# Patient Record
Sex: Female | Born: 1950 | Race: Black or African American | Hispanic: No | Smoking: Never smoker
Health system: Southern US, Community
[De-identification: ages and names within clinical notes are randomized; demographics above are authoritative.]

---

## 2006-10-08 ENCOUNTER — Emergency Department (HOSPITAL_COMMUNITY): Admission: EM | Admit: 2006-10-08 | Discharge: 2006-10-08 | Payer: Self-pay | Admitting: Emergency Medicine

## 2006-10-08 ENCOUNTER — Encounter: Admission: RE | Admit: 2006-10-08 | Discharge: 2006-10-08 | Payer: Self-pay | Admitting: Occupational Medicine

## 2007-04-15 ENCOUNTER — Encounter: Admission: RE | Admit: 2007-04-15 | Discharge: 2007-04-15 | Payer: Self-pay | Admitting: Obstetrics and Gynecology

## 2007-05-13 ENCOUNTER — Encounter (INDEPENDENT_AMBULATORY_CARE_PROVIDER_SITE_OTHER): Payer: Self-pay | Admitting: Gastroenterology

## 2007-05-13 ENCOUNTER — Ambulatory Visit (HOSPITAL_COMMUNITY): Admission: RE | Admit: 2007-05-13 | Discharge: 2007-05-13 | Payer: Self-pay | Admitting: Gastroenterology

## 2008-07-03 IMAGING — CR DG SHOULDER 2+V*R*
4 series · 4 of 4 positions shown · non-contrast
Comparison: None.

CLINICAL DATA: Fell down and landed on shoulder. Stiffness and pain.
 RIGHT SHOULDER ? 3 VIEW:

[view not recorded (1 of 4)]
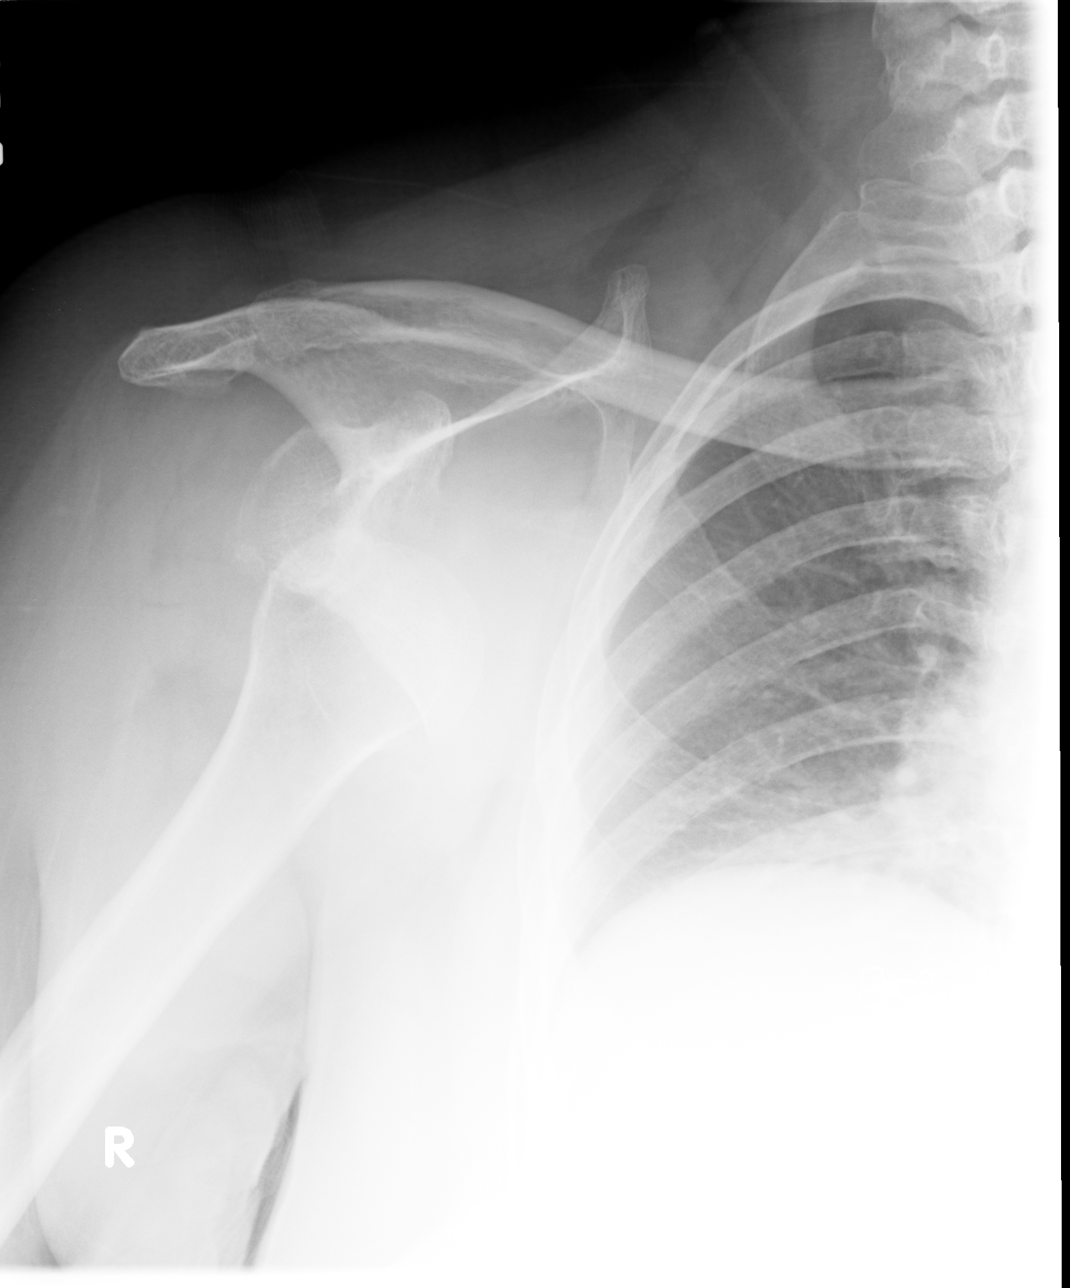

[view not recorded (2 of 4)]
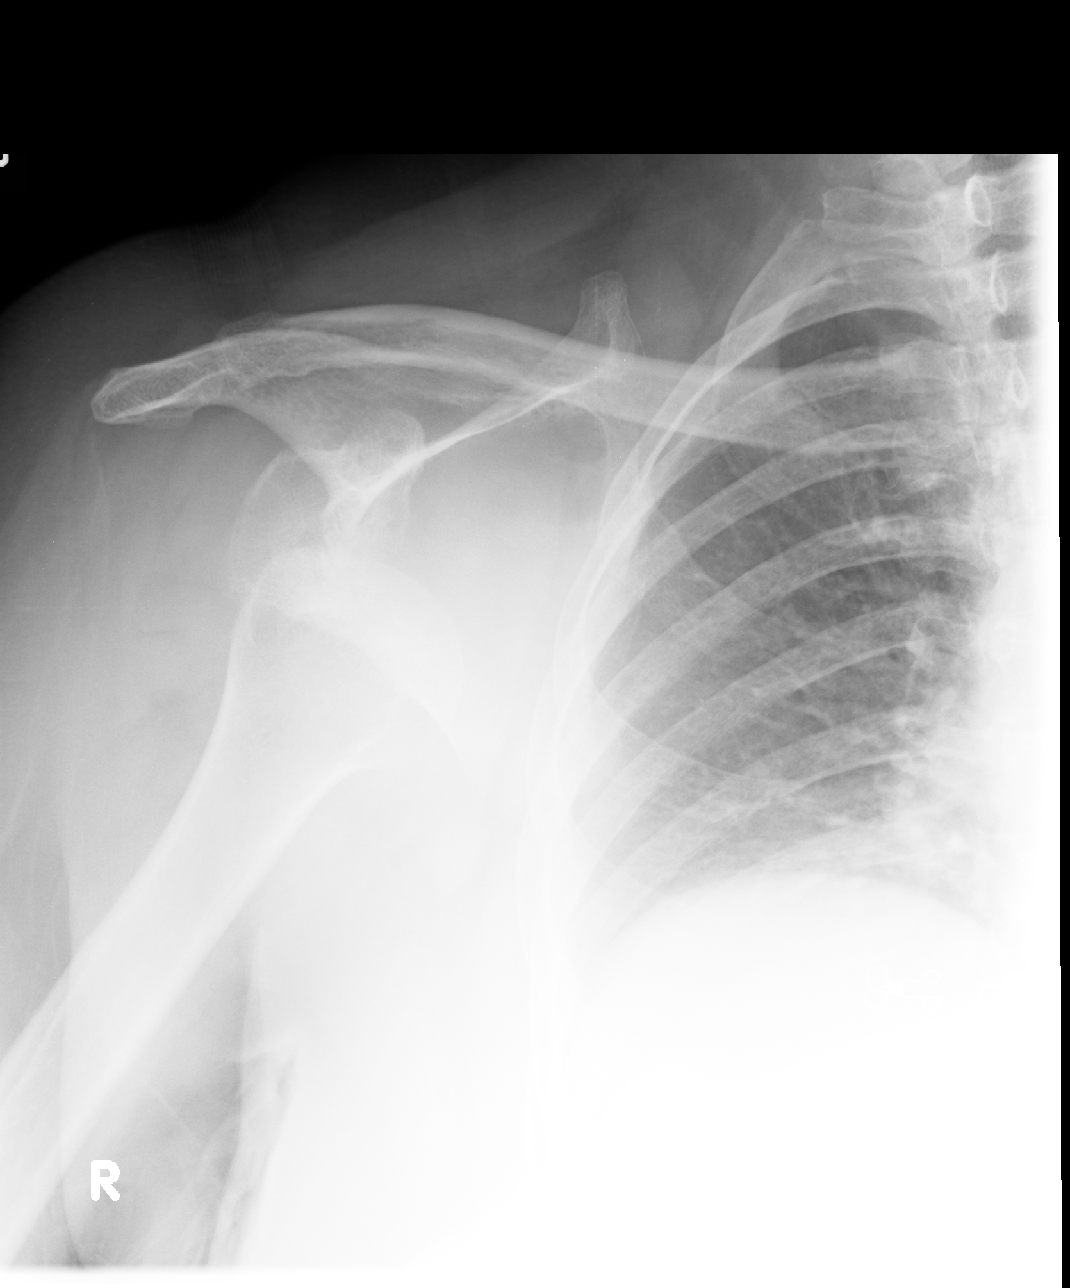

[view not recorded (3 of 4)]
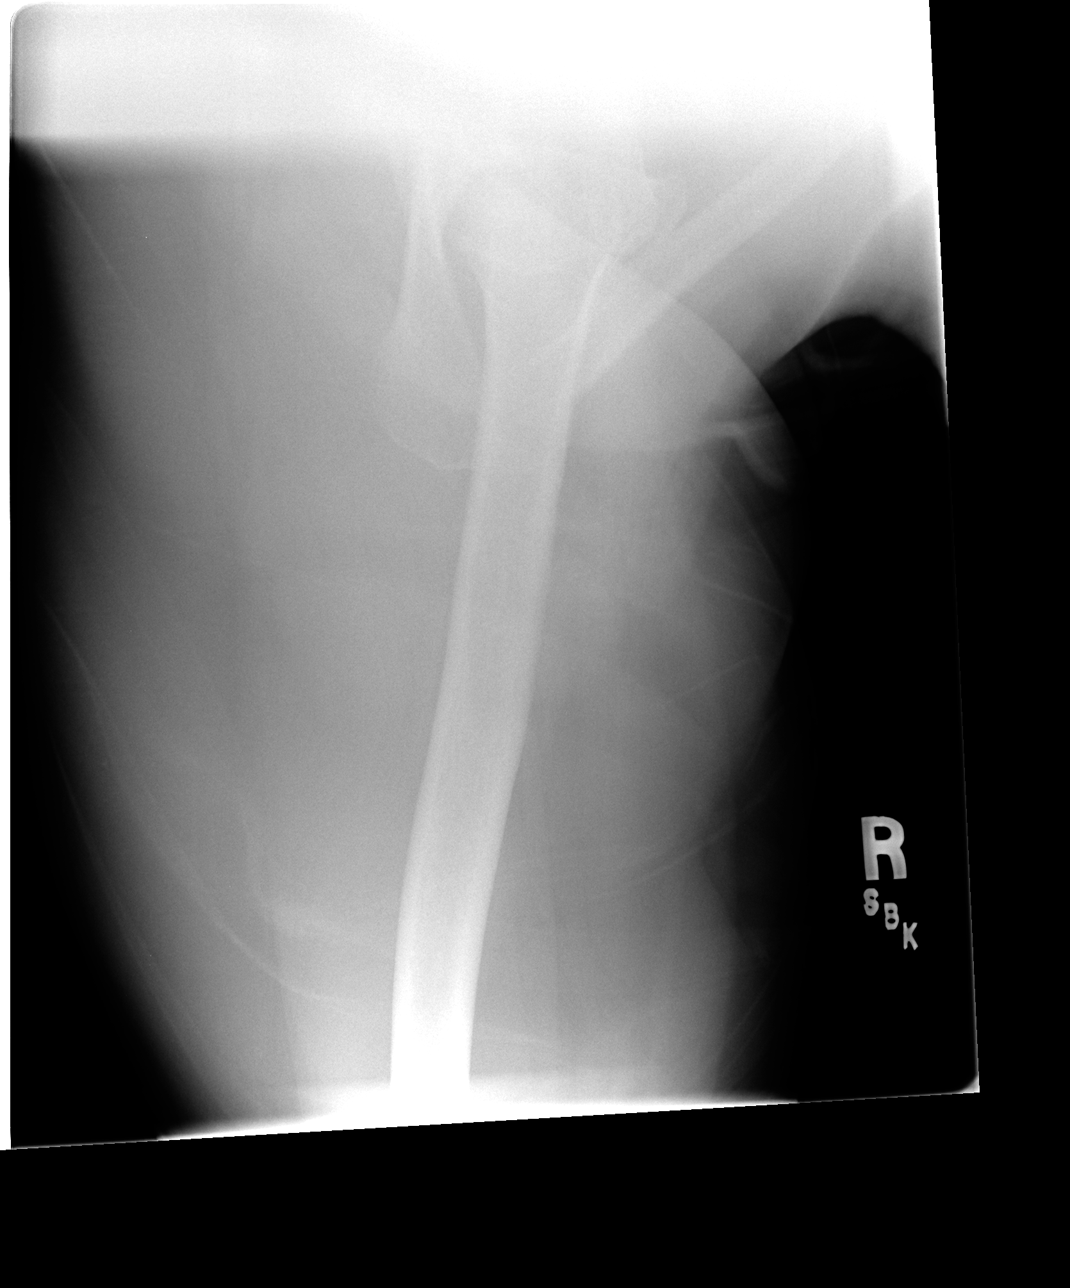

[view not recorded (4 of 4)]
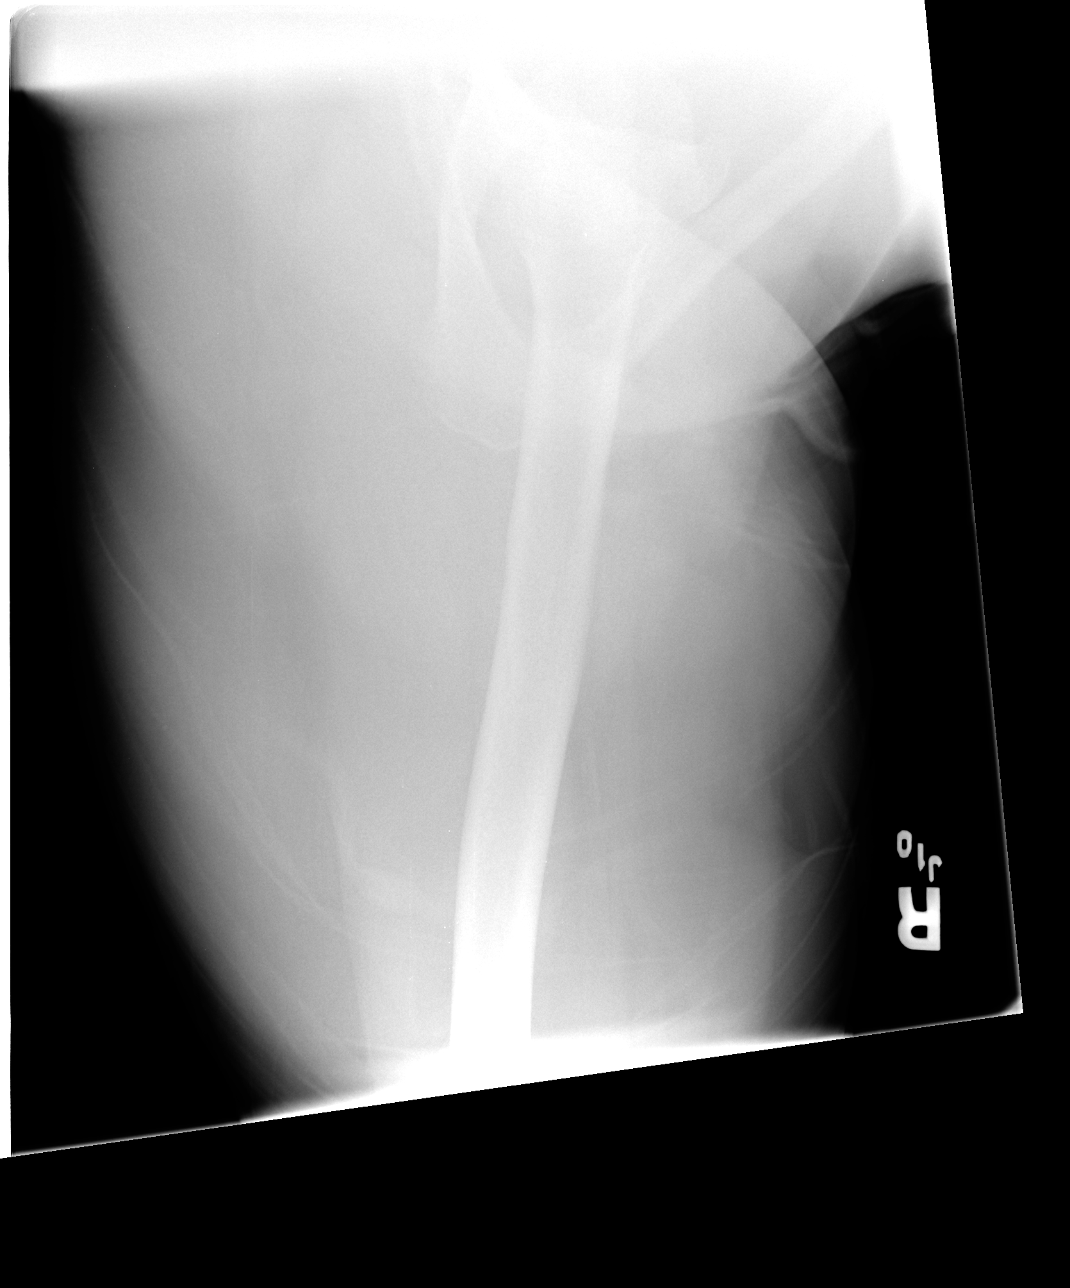

[4 of 4 positions shown; findings below may reference images not displayed]

FINDINGS: The humeral head is anteriorly dislocated.  A fracture is not readily identified. The acromioclavicular joint and visualized right ribs are intact.
IMPRESSION: Anterior right shoulder dislocation.

## 2011-03-21 NOTE — Op Note (Signed)
[name], Amber Pham                ACCOUNT NO.:  [id number]   MEDICAL RECORD NO.:  [id number]          PATIENT TYPE:  AMB   LOCATION:  ENDO                         FACILITY:  [facility]   PHYSICIAN:  Anselmo Rod, M.D.  DATE OF BIRTH:  August 11, 1951   DATE OF PROCEDURE:  05/13/2007  DATE OF DISCHARGE:                               OPERATIVE REPORT   PROCEDURE:  Colonoscopy with multiple cold biopsies.   ENDOSCOPIST:  Anselmo Rod, M.D.   INSTRUMENT USED:  Pentax video colonoscope.   INDICATIONS FOR PROCEDURE:  A 60 year old African-American female  undergoing screening colonoscopy to rule out colonic polyps, masses,  etc.   PREPROCEDURE PREPARATION:  Informed consent was procured from the  patient. The patient fasted for eight hours prior to the procedure and  prepped with a bottle of magnesium citrate and a gallon of Nulytely the  night prior to the procedure. The risks and benefits of the procedure  including a 10% missed rate of cancer and polyp were discussed with the  patient as well.   PREPROCEDURE PHYSICAL:  The patient had stable vital signs. Neck supple.  Chest clear to auscultation. S1, S2 regular. Abdomen soft with normal  bowel sounds.   DESCRIPTION OF PROCEDURE:  The patient was placed in the left lateral  decubitus position and sedated with 50 mcg of Fentanyl and 5 mg of  Versed given intravenously in slow incremental doses. Once the patient  was adequately sedated and maintained on low flow oxygen and continuous  cardiac monitoring, the Olympus video colonoscope was advanced from the  rectum to the cecum. A small polyp was biopsied from the mid right  colon; another polyp was biopsied from 90 cm. They were both placed in  the same bottle, there was no evidence of diverticulosis. The terminal  ileum appeared healthy and without lesions. There was some residual  stool in the right colon, multiple washings were done. The patient's  position was changed from the left  lateral supine position with gentle  application of abdominal pressure at the cecal base. Retroflexion of the  rectum revealed no evidence of hemorrhoids.   IMPRESSION:  1. Small sessile polyp biopsied from the right colon and another polyp      biopsied from 90 cm.  2. No evidence of diverticulosis.  3. Normal terminal ileum.   RECOMMENDATIONS:  1. Await pathology results.  2. Avoid all nonsteroidals including aspirin for the next 2 weeks.  3. Repeat colonoscopy depending on pathology results.  4. Outpatient followup as the need arising in the future.      Anselmo Rod, M.D.  Electronically Signed     JNM/MEDQ  D:  05/13/2007  T:  05/13/2007  Job:  [id number]   cc:   Naima A. Normand Sloop, M.D.  Fax: [phone]

## 2011-08-16 ENCOUNTER — Other Ambulatory Visit (HOSPITAL_COMMUNITY): Payer: Self-pay | Admitting: Obstetrics and Gynecology

## 2011-08-16 DIAGNOSIS — Z1231 Encounter for screening mammogram for malignant neoplasm of breast: Secondary | ICD-10-CM

## 2011-08-29 ENCOUNTER — Ambulatory Visit (HOSPITAL_COMMUNITY)
Admission: RE | Admit: 2011-08-29 | Discharge: 2011-08-29 | Disposition: A | Discharge status: Home / Self Care | Payer: 59 | Source: Ambulatory Visit | Attending: Obstetrics and Gynecology | Admitting: Obstetrics and Gynecology

## 2011-08-29 DIAGNOSIS — Z1231 Encounter for screening mammogram for malignant neoplasm of breast: Secondary | ICD-10-CM

## 2012-08-08 ENCOUNTER — Other Ambulatory Visit: Payer: Self-pay | Admitting: Obstetrics and Gynecology

## 2012-08-08 DIAGNOSIS — Z1231 Encounter for screening mammogram for malignant neoplasm of breast: Secondary | ICD-10-CM

## 2012-08-29 ENCOUNTER — Ambulatory Visit (HOSPITAL_COMMUNITY)
Admission: RE | Admit: 2012-08-29 | Discharge: 2012-08-29 | Disposition: A | Discharge status: Home / Self Care | Payer: 59 | Source: Ambulatory Visit | Attending: Obstetrics and Gynecology | Admitting: Obstetrics and Gynecology

## 2012-08-29 DIAGNOSIS — Z1231 Encounter for screening mammogram for malignant neoplasm of breast: Secondary | ICD-10-CM | POA: Insufficient documentation

## 2013-08-20 ENCOUNTER — Other Ambulatory Visit: Payer: Self-pay | Admitting: Obstetrics and Gynecology

## 2013-08-20 DIAGNOSIS — Z1231 Encounter for screening mammogram for malignant neoplasm of breast: Secondary | ICD-10-CM

## 2013-09-04 ENCOUNTER — Ambulatory Visit (HOSPITAL_COMMUNITY)
Admission: RE | Admit: 2013-09-04 | Discharge: 2013-09-04 | Disposition: A | Discharge status: Home / Self Care | Payer: 59 | Source: Ambulatory Visit | Attending: Obstetrics and Gynecology | Admitting: Obstetrics and Gynecology

## 2013-09-04 DIAGNOSIS — Z1231 Encounter for screening mammogram for malignant neoplasm of breast: Secondary | ICD-10-CM | POA: Insufficient documentation

## 2014-11-29 ENCOUNTER — Encounter (HOSPITAL_COMMUNITY): Payer: Self-pay | Admitting: Emergency Medicine

## 2014-11-29 ENCOUNTER — Encounter (HOSPITAL_COMMUNITY)
Admission: EM | Disposition: A | Discharge status: Nursing Home (Includes ICF) | Payer: Self-pay | Source: Home / Self Care

## 2014-11-29 ENCOUNTER — Emergency Department (HOSPITAL_COMMUNITY): Payer: No Typology Code available for payment source

## 2014-11-29 ENCOUNTER — Emergency Department (HOSPITAL_COMMUNITY): Payer: No Typology Code available for payment source | Admitting: Certified Registered Nurse Anesthetist

## 2014-11-29 ENCOUNTER — Inpatient Hospital Stay (HOSPITAL_COMMUNITY)
Admission: EM | Admit: 2014-11-29 | Discharge: 2014-12-07 | DRG: 570 | Disposition: A | Discharge status: Other Acute Inpatient Hospital | Payer: No Typology Code available for payment source | Attending: Surgery | Admitting: Surgery

## 2014-11-29 DIAGNOSIS — R0902 Hypoxemia: Secondary | ICD-10-CM | POA: Diagnosis not present

## 2014-11-29 DIAGNOSIS — N289 Disorder of kidney and ureter, unspecified: Secondary | ICD-10-CM | POA: Diagnosis present

## 2014-11-29 DIAGNOSIS — A419 Sepsis, unspecified organism: Secondary | ICD-10-CM | POA: Diagnosis present

## 2014-11-29 DIAGNOSIS — Z6841 Body Mass Index (BMI) 40.0 and over, adult: Secondary | ICD-10-CM

## 2014-11-29 DIAGNOSIS — L0231 Cutaneous abscess of buttock: Secondary | ICD-10-CM | POA: Diagnosis present

## 2014-11-29 DIAGNOSIS — M726 Necrotizing fasciitis: Secondary | ICD-10-CM | POA: Diagnosis present

## 2014-11-29 DIAGNOSIS — E46 Unspecified protein-calorie malnutrition: Secondary | ICD-10-CM | POA: Diagnosis present

## 2014-11-29 DIAGNOSIS — Z79899 Other long term (current) drug therapy: Secondary | ICD-10-CM

## 2014-11-29 DIAGNOSIS — E876 Hypokalemia: Secondary | ICD-10-CM | POA: Diagnosis present

## 2014-11-29 DIAGNOSIS — D62 Acute posthemorrhagic anemia: Secondary | ICD-10-CM | POA: Diagnosis not present

## 2014-11-29 DIAGNOSIS — M7989 Other specified soft tissue disorders: Secondary | ICD-10-CM | POA: Diagnosis present

## 2014-11-29 DIAGNOSIS — Y808 Miscellaneous physical medicine devices associated with adverse incidents, not elsewhere classified: Secondary | ICD-10-CM

## 2014-11-29 HISTORY — PX: INCISION AND DRAINAGE ABSCESS: SHX5864

## 2014-11-29 LAB — HEMOGLOBIN A1C
Hgb A1c MFr Bld: 6 % — ABNORMAL HIGH (ref <5.7)
MEAN PLASMA GLUCOSE: 126 mg/dL — AB (ref <117)

## 2014-11-29 LAB — URINALYSIS, ROUTINE W REFLEX MICROSCOPIC
Glucose, UA: NEGATIVE mg/dL
KETONES UR: NEGATIVE mg/dL
LEUKOCYTES UA: NEGATIVE
Nitrite: NEGATIVE
PH: 5.5 (ref 5.0–8.0)
PROTEIN: 100 mg/dL — AB
Specific Gravity, Urine: 1.017 (ref 1.005–1.030)
Urobilinogen, UA: 4 mg/dL — ABNORMAL HIGH (ref 0.0–1.0)

## 2014-11-29 LAB — COMPREHENSIVE METABOLIC PANEL
ALBUMIN: 2.4 g/dL — AB (ref 3.5–5.2)
ALK PHOS: 109 U/L (ref 39–117)
ALT: 27 U/L (ref 0–35)
ANION GAP: 14 (ref 5–15)
AST: 120 U/L — AB (ref 0–37)
BUN: 61 mg/dL — AB (ref 6–23)
CALCIUM: 8.5 mg/dL (ref 8.4–10.5)
CO2: 21 mmol/L (ref 19–32)
Chloride: 96 mmol/L (ref 96–112)
Creatinine, Ser: 2.23 mg/dL — ABNORMAL HIGH (ref 0.50–1.10)
GFR calc Af Amer: 26 mL/min — ABNORMAL LOW (ref >90)
GFR, EST NON AFRICAN AMERICAN: 22 mL/min — AB (ref >90)
Glucose, Bld: 125 mg/dL — ABNORMAL HIGH (ref 70–99)
Potassium: 3.3 mmol/L — ABNORMAL LOW (ref 3.5–5.1)
SODIUM: 131 mmol/L — AB (ref 135–145)
TOTAL PROTEIN: 7 g/dL (ref 6.0–8.3)
Total Bilirubin: 1.8 mg/dL — ABNORMAL HIGH (ref 0.3–1.2)

## 2014-11-29 LAB — GLUCOSE, CAPILLARY
GLUCOSE-CAPILLARY: 138 mg/dL — AB (ref 70–99)
Glucose-Capillary: 102 mg/dL — ABNORMAL HIGH (ref 70–99)
Glucose-Capillary: 106 mg/dL — ABNORMAL HIGH (ref 70–99)
Glucose-Capillary: 120 mg/dL — ABNORMAL HIGH (ref 70–99)
Glucose-Capillary: 127 mg/dL — ABNORMAL HIGH (ref 70–99)

## 2014-11-29 LAB — I-STAT TROPONIN, ED: Troponin i, poc: 0 ng/mL (ref 0.00–0.08)

## 2014-11-29 LAB — CBC WITH DIFFERENTIAL/PLATELET
Basophils Absolute: 0.3 10*3/uL — ABNORMAL HIGH (ref 0.0–0.1)
Basophils Relative: 1 % (ref 0–1)
Eosinophils Absolute: 0 10*3/uL (ref 0.0–0.7)
Eosinophils Relative: 0 % (ref 0–5)
HCT: 32.6 % — ABNORMAL LOW (ref 36.0–46.0)
Hemoglobin: 11.6 g/dL — ABNORMAL LOW (ref 12.0–15.0)
LYMPHS PCT: 8 % — AB (ref 12–46)
Lymphs Abs: 2.3 10*3/uL (ref 0.7–4.0)
MCH: 31.2 pg (ref 26.0–34.0)
MCHC: 35.6 g/dL (ref 30.0–36.0)
MCV: 87.6 fL (ref 78.0–100.0)
MONOS PCT: 6 % (ref 3–12)
Monocytes Absolute: 1.7 10*3/uL — ABNORMAL HIGH (ref 0.1–1.0)
NEUTROS ABS: 24.1 10*3/uL — AB (ref 1.7–7.7)
Neutrophils Relative %: 85 % — ABNORMAL HIGH (ref 43–77)
Platelets: 296 10*3/uL (ref 150–400)
RBC: 3.72 MIL/uL — ABNORMAL LOW (ref 3.87–5.11)
RDW: 14.1 % (ref 11.5–15.5)
WBC Morphology: INCREASED
WBC: 28.4 10*3/uL — ABNORMAL HIGH (ref 4.0–10.5)

## 2014-11-29 LAB — URINE MICROSCOPIC-ADD ON

## 2014-11-29 LAB — I-STAT CG4 LACTIC ACID, ED: LACTIC ACID, VENOUS: 3.24 mmol/L — AB (ref 0.5–2.0)

## 2014-11-29 LAB — PROCALCITONIN: Procalcitonin: 14.76 ng/mL

## 2014-11-29 SURGERY — INCISION AND DRAINAGE, ABSCESS
Anesthesia: General | Site: Buttocks

## 2014-11-29 MED ORDER — VANCOMYCIN HCL 10 G IV SOLR
1750.0000 mg | INTRAVENOUS | Status: DC
  Administered 2014-11-30 – 2014-12-02 (×3): 1750 mg via INTRAVENOUS
  Filled 2014-11-29 (×5): qty 1750

## 2014-11-29 MED ORDER — SODIUM CHLORIDE 0.9 % IR SOLN
Status: DC | PRN
  Administered 2014-11-29 (×2): 3000 mL

## 2014-11-29 MED ORDER — OXYCODONE-ACETAMINOPHEN 5-325 MG PO TABS
1.0000 | ORAL_TABLET | ORAL | Status: DC | PRN
  Administered 2014-11-29 – 2014-12-01 (×2): 2 via ORAL
  Administered 2014-12-01: 1 via ORAL
  Administered 2014-12-02 – 2014-12-05 (×6): 2 via ORAL
  Filled 2014-11-29 (×10): qty 2

## 2014-11-29 MED ORDER — PHENYLEPHRINE HCL 10 MG/ML IJ SOLN
10.0000 mg | INTRAMUSCULAR | Status: DC | PRN
  Administered 2014-11-29: 50 ug/min via INTRAVENOUS

## 2014-11-29 MED ORDER — PIPERACILLIN-TAZOBACTAM 3.375 G IVPB 30 MIN
3.3750 g | Freq: Once | INTRAVENOUS | Status: AC
  Administered 2014-11-29: 3.375 g via INTRAVENOUS
  Filled 2014-11-29: qty 50

## 2014-11-29 MED ORDER — HYDROMORPHONE HCL 1 MG/ML IJ SOLN
1.0000 mg | INTRAMUSCULAR | Status: DC | PRN
  Administered 2014-11-30 – 2014-12-03 (×8): 1 mg via INTRAVENOUS
  Filled 2014-11-29 (×8): qty 1

## 2014-11-29 MED ORDER — FENTANYL CITRATE 0.05 MG/ML IJ SOLN
INTRAMUSCULAR | Status: DC | PRN
  Administered 2014-11-29: 50 ug via INTRAVENOUS
  Administered 2014-11-29: 100 ug via INTRAVENOUS

## 2014-11-29 MED ORDER — HYDROMORPHONE HCL 1 MG/ML IJ SOLN
INTRAMUSCULAR | Status: AC
  Administered 2014-11-29: 1 mg
  Filled 2014-11-29: qty 1

## 2014-11-29 MED ORDER — LIDOCAINE HCL (CARDIAC) 20 MG/ML IV SOLN
INTRAVENOUS | Status: DC | PRN
  Administered 2014-11-29: 100 mg via INTRAVENOUS

## 2014-11-29 MED ORDER — INSULIN ASPART 100 UNIT/ML ~~LOC~~ SOLN
0.0000 [IU] | SUBCUTANEOUS | Status: DC
  Administered 2014-11-29 – 2014-11-30 (×3): 2 [IU] via SUBCUTANEOUS
  Administered 2014-11-30: 3 [IU] via SUBCUTANEOUS
  Administered 2014-11-30 – 2014-12-02 (×5): 2 [IU] via SUBCUTANEOUS
  Administered 2014-12-02: 3 [IU] via SUBCUTANEOUS
  Administered 2014-12-02: 2 [IU] via SUBCUTANEOUS
  Administered 2014-12-03: 3 [IU] via SUBCUTANEOUS
  Administered 2014-12-03: 5 [IU] via SUBCUTANEOUS
  Administered 2014-12-03 – 2014-12-06 (×10): 2 [IU] via SUBCUTANEOUS
  Administered 2014-12-06: 3 [IU] via SUBCUTANEOUS
  Administered 2014-12-06 – 2014-12-07 (×2): 2 [IU] via SUBCUTANEOUS

## 2014-11-29 MED ORDER — PHENYLEPHRINE HCL 10 MG/ML IJ SOLN
INTRAMUSCULAR | Status: AC
  Filled 2014-11-29: qty 1

## 2014-11-29 MED ORDER — PROMETHAZINE HCL 25 MG/ML IJ SOLN
INTRAMUSCULAR | Status: AC
  Filled 2014-11-29: qty 1

## 2014-11-29 MED ORDER — PIPERACILLIN-TAZOBACTAM 3.375 G IVPB
3.3750 g | Freq: Three times a day (TID) | INTRAVENOUS | Status: DC
  Administered 2014-11-29 – 2014-12-04 (×15): 3.375 g via INTRAVENOUS
  Filled 2014-11-29 (×16): qty 50

## 2014-11-29 MED ORDER — SODIUM CHLORIDE 0.9 % IV BOLUS (SEPSIS): 1000.0000 mL | INTRAVENOUS | Status: AC

## 2014-11-29 MED ORDER — DEXTROSE-NACL 5-0.9 % IV SOLN
INTRAVENOUS | Status: DC
  Administered 2014-11-29 – 2014-12-07 (×10): via INTRAVENOUS

## 2014-11-29 MED ORDER — FENTANYL CITRATE 0.05 MG/ML IJ SOLN
INTRAMUSCULAR | Status: AC
  Filled 2014-11-29: qty 5

## 2014-11-29 MED ORDER — PHENYLEPHRINE 40 MCG/ML (10ML) SYRINGE FOR IV PUSH (FOR BLOOD PRESSURE SUPPORT)
PREFILLED_SYRINGE | INTRAVENOUS | Status: AC
  Filled 2014-11-29: qty 10

## 2014-11-29 MED ORDER — VANCOMYCIN HCL 10 G IV SOLR
2500.0000 mg | INTRAVENOUS | Status: AC
  Filled 2014-11-29: qty 2500

## 2014-11-29 MED ORDER — PHENYLEPHRINE HCL 10 MG/ML IJ SOLN
INTRAMUSCULAR | Status: DC | PRN
  Administered 2014-11-29 (×4): 80 ug via INTRAVENOUS

## 2014-11-29 MED ORDER — VANCOMYCIN HCL 1000 MG IV SOLR
1000.0000 mg | INTRAVENOUS | Status: DC | PRN
  Administered 2014-11-29: 2500 mg via INTRAVENOUS

## 2014-11-29 MED ORDER — ONDANSETRON HCL 4 MG PO TABS: 4.0000 mg | ORAL_TABLET | Freq: Four times a day (QID) | ORAL | Status: DC | PRN

## 2014-11-29 MED ORDER — LIDOCAINE HCL (CARDIAC) 20 MG/ML IV SOLN
INTRAVENOUS | Status: AC
  Filled 2014-11-29: qty 5

## 2014-11-29 MED ORDER — MIDAZOLAM HCL 5 MG/5ML IJ SOLN
INTRAMUSCULAR | Status: DC | PRN
  Administered 2014-11-29: 2 mg via INTRAVENOUS

## 2014-11-29 MED ORDER — ROCURONIUM BROMIDE 100 MG/10ML IV SOLN
INTRAVENOUS | Status: DC | PRN
  Administered 2014-11-29: 20 mg via INTRAVENOUS

## 2014-11-29 MED ORDER — ONDANSETRON HCL 4 MG/2ML IJ SOLN: 4.0000 mg | Freq: Four times a day (QID) | INTRAMUSCULAR | Status: DC | PRN

## 2014-11-29 MED ORDER — LACTATED RINGERS IV SOLN
INTRAVENOUS | Status: DC | PRN
  Administered 2014-11-29: 12:00:00 via INTRAVENOUS

## 2014-11-29 MED ORDER — PROPOFOL 10 MG/ML IV BOLUS
INTRAVENOUS | Status: AC
  Filled 2014-11-29: qty 20

## 2014-11-29 MED ORDER — SUCCINYLCHOLINE CHLORIDE 20 MG/ML IJ SOLN
INTRAMUSCULAR | Status: DC | PRN
  Administered 2014-11-29: 100 mg via INTRAVENOUS

## 2014-11-29 MED ORDER — NEOSTIGMINE METHYLSULFATE 10 MG/10ML IV SOLN
INTRAVENOUS | Status: DC | PRN
  Administered 2014-11-29: 4 mg via INTRAVENOUS

## 2014-11-29 MED ORDER — ROCURONIUM BROMIDE 100 MG/10ML IV SOLN
INTRAVENOUS | Status: AC
  Filled 2014-11-29: qty 1

## 2014-11-29 MED ORDER — HYDROMORPHONE HCL 1 MG/ML IJ SOLN
0.2500 mg | INTRAMUSCULAR | Status: DC | PRN
  Administered 2014-11-29 (×4): 0.5 mg via INTRAVENOUS

## 2014-11-29 MED ORDER — GLYCOPYRROLATE 0.2 MG/ML IJ SOLN
INTRAMUSCULAR | Status: DC | PRN
  Administered 2014-11-29: 0.6 mg via INTRAVENOUS

## 2014-11-29 MED ORDER — PROPOFOL 10 MG/ML IV BOLUS
INTRAVENOUS | Status: DC | PRN
  Administered 2014-11-29: 200 mg via INTRAVENOUS

## 2014-11-29 MED ORDER — MIDAZOLAM HCL 2 MG/2ML IJ SOLN
INTRAMUSCULAR | Status: AC
  Filled 2014-11-29: qty 2

## 2014-11-29 MED ORDER — SODIUM CHLORIDE 0.9 % IV BOLUS (SEPSIS)
1000.0000 mL | INTRAVENOUS | Status: DC
  Administered 2014-11-29 (×3): 1000 mL via INTRAVENOUS

## 2014-11-29 MED ORDER — NEOSTIGMINE METHYLSULFATE 10 MG/10ML IV SOLN
INTRAVENOUS | Status: AC
  Filled 2014-11-29: qty 1

## 2014-11-29 MED ORDER — ONDANSETRON HCL 4 MG/2ML IJ SOLN
INTRAMUSCULAR | Status: AC
Start: 2014-11-29 — End: 2014-11-29
  Filled 2014-11-29: qty 2

## 2014-11-29 MED ORDER — HYDROMORPHONE HCL 1 MG/ML IJ SOLN
INTRAMUSCULAR | Status: AC
  Filled 2014-11-29: qty 1

## 2014-11-29 MED ORDER — SODIUM CHLORIDE 0.9 % IJ SOLN
INTRAMUSCULAR | Status: AC
  Filled 2014-11-29: qty 10

## 2014-11-29 MED ORDER — ACETAMINOPHEN 325 MG PO TABS: 650.0000 mg | ORAL_TABLET | Freq: Four times a day (QID) | ORAL | Status: DC | PRN

## 2014-11-29 MED ORDER — BOOST / RESOURCE BREEZE PO LIQD
1.0000 | Freq: Three times a day (TID) | ORAL | Status: DC
  Administered 2014-11-30 – 2014-12-07 (×16): 1 via ORAL

## 2014-11-29 MED ORDER — ENOXAPARIN SODIUM 40 MG/0.4ML ~~LOC~~ SOLN
40.0000 mg | Freq: Every day | SUBCUTANEOUS | Status: DC
  Administered 2014-11-29: 40 mg via SUBCUTANEOUS
  Filled 2014-11-29 (×2): qty 0.4

## 2014-11-29 MED ORDER — PROMETHAZINE HCL 25 MG/ML IJ SOLN: 6.2500 mg | INTRAMUSCULAR | Status: DC | PRN

## 2014-11-29 MED ORDER — GLYCOPYRROLATE 0.2 MG/ML IJ SOLN
INTRAMUSCULAR | Status: AC
  Filled 2014-11-29: qty 3

## 2014-11-29 SURGICAL SUPPLY — 29 items
BLADE SURG 15 STRL LF DISP TIS (BLADE) ×1 IMPLANT
BLADE SURG 15 STRL SS (BLADE) ×2
BNDG GAUZE ELAST 4 BULKY (GAUZE/BANDAGES/DRESSINGS) ×6 IMPLANT
COVER SURGICAL LIGHT HANDLE (MISCELLANEOUS) IMPLANT
DECANTER SPIKE VIAL GLASS SM (MISCELLANEOUS) IMPLANT
DRAPE LAPAROSCOPIC ABDOMINAL (DRAPES) IMPLANT
DRAPE PED LAPAROTOMY (DRAPES) ×3 IMPLANT
DRSG PAD ABDOMINAL 8X10 ST (GAUZE/BANDAGES/DRESSINGS) IMPLANT
ELECT REM PT RETURN 9FT ADLT (ELECTROSURGICAL) ×3
ELECTRODE REM PT RTRN 9FT ADLT (ELECTROSURGICAL) ×1 IMPLANT
GAUZE SPONGE 4X4 12PLY STRL (GAUZE/BANDAGES/DRESSINGS) IMPLANT
GLOVE BIO SURGEON STRL SZ7.5 (GLOVE) ×3 IMPLANT
GOWN STRL REUS W/TWL LRG LVL3 (GOWN DISPOSABLE) ×6 IMPLANT
KIT BASIN OR (CUSTOM PROCEDURE TRAY) ×3 IMPLANT
NEEDLE HYPO 25X1 1.5 SAFETY (NEEDLE) IMPLANT
NS IRRIG 1000ML POUR BTL (IV SOLUTION) ×3 IMPLANT
PACK GENERAL/GYN (CUSTOM PROCEDURE TRAY) ×3 IMPLANT
PENCIL BUTTON HOLSTER BLD 10FT (ELECTRODE) ×3 IMPLANT
SPONGE LAP 18X18 X RAY DECT (DISPOSABLE) ×3 IMPLANT
SUT MNCRL AB 4-0 PS2 18 (SUTURE) IMPLANT
SUT VIC AB 3-0 SH 27 (SUTURE)
SUT VIC AB 3-0 SH 27XBRD (SUTURE) IMPLANT
SWAB COLLECTION DEVICE MRSA (MISCELLANEOUS) ×3 IMPLANT
SYR BULB 3OZ (MISCELLANEOUS) ×3 IMPLANT
SYR CONTROL 10ML LL (SYRINGE) IMPLANT
TAPE CLOTH SURG 6X10 WHT LF (GAUZE/BANDAGES/DRESSINGS) ×3 IMPLANT
TOWEL OR 17X26 10 PK STRL BLUE (TOWEL DISPOSABLE) ×3 IMPLANT
TUBE ANAEROBIC SPECIMEN COL (MISCELLANEOUS) ×3 IMPLANT
YANKAUER SUCT BULB TIP NO VENT (SUCTIONS) ×3 IMPLANT

## 2014-11-29 NOTE — Transfer of Care (Signed)
Immediate Anesthesia Transfer of Care Note  Patient: Amber Pham  Procedure(s) Performed: Procedure(s): INCISION AND DRAINAGE ABSCESS (N/A)  Patient Location: PACU  Anesthesia Type:General  Level of Consciousness: awake, alert  and oriented  Airway & Oxygen Therapy: Patient Spontanous Breathing and Patient connected to face mask oxygen  Post-op Assessment: Report given to PACU RN and Post -op Vital signs reviewed and stable  Post vital signs: Reviewed and stable  Complications: No apparent anesthesia complications

## 2014-11-29 NOTE — ED Notes (Signed)
Attempted IV access w/o success. Notified Jen H to gain IV access using [place]

## 2014-11-29 NOTE — H&P (Signed)
Amber Pham is an 64 y.o. female.   Chief Complaint: right buttock abscess HPI: asked to se at the request of Dr Effie ShyWentz for right buttock infection abscess.  Pt complains 1 week history of right buttock pain drainage and foul smelling odor.  Has neglected it and no care sought out until today. Complains of right buttock pain,  Drainage and foul smell.  Denies tobacco use and states she has no medical problems.   History reviewed. No pertinent past medical history.  History reviewed. No pertinent past surgical history.  No family history on file. Social History:  reports that she has never smoked. She does not have any smokeless tobacco history on file. She reports that she does not drink alcohol. Her drug history is not on file.  Allergies: No Known Allergies   (Not in a hospital admission)  Results for orders placed or performed during the hospital encounter of 11/29/14 (from the past 48 hour(s))  I-stat troponin, ED (not at [facility])     Status: None   Collection Time: 11/29/14 10:29 AM  Result Value Ref Range   Troponin i, poc 0.00 0.00 - 0.08 ng/mL   Comment 3            Comment: Due to the release kinetics of cTnI, a negative result within the first hours of the onset of symptoms does not rule out myocardial infarction with certainty. If myocardial infarction is still suspected, repeat the test at appropriate intervals.   I-Stat CG4 Lactic Acid, ED (not at [facility])     Status: Abnormal   Collection Time: 11/29/14 10:31 AM  Result Value Ref Range   Lactic Acid, Venous 3.24 (HH) 0.5 - 2.0 mmol/L   Comment NOTIFIED PHYSICIAN    No results found.  Review of Systems  Constitutional: Negative for fever and chills.  HENT: Negative.   Respiratory: Negative.   Cardiovascular: Negative.   Gastrointestinal: Negative.   Musculoskeletal: Negative.   Neurological: Negative.     Blood pressure 109/61, pulse 137, temperature 97.7 F (36.5 C), temperature source Oral, resp. rate 25, weight  330 lb (149.687 kg), SpO2 96 %. Physical Exam  Constitutional: She is oriented to person, place, and time. She appears well-developed and well-nourished.  HENT:  Head: Normocephalic and atraumatic.  Eyes: Pupils are equal, round, and reactive to light. No scleral icterus.  Neck: Normal range of motion.  Cardiovascular: Regular rhythm.  Tachycardia present.   Respiratory: Effort normal and breath sounds normal.  GI: Soft. Bowel sounds are normal.  Musculoskeletal: Normal range of motion.  Neurological: She is alert and oriented to person, place, and time.  Skin:        Assessment/Plan Necrotizing right buttock abscess/infection Recommend surgical debridement right buttock abscess/  Infection The procedure has been discussed with the patient.  Alternative therapies have been discussed with the patient.  Operative risks include bleeding,  Infection,  Organ injury,  Nerve injury,  Blood vessel injury,  DVT,  Pulmonary embolism,  Death,  And possible reoperation.  Medical management risks include worsening of present situation.  The success of the procedure is 50 -90 % at treating patients symptoms.  The patient understands and agrees to proceed.  Mikella Linsley A. 11/29/2014, 11:02 AM

## 2014-11-29 NOTE — Op Note (Signed)
1.  Progress note or procedure note with a detailed description of the procedure.  Preoperative diagnosis 15 cm x 15 cm necrotizing soft tissue infection  Right buttock  Post operative diagnosis : same  Procedure :  Debridement full thickness right buttock wound   Surgeon : Erroll Luna MD  2.  Tool used for debridement (curette, scapel, etc.)  Scalpel and cautery with pulse vac lavage  3.  Frequency of surgical debridement.   Once   4.  Measurement of total devitalized tissue (wound surface) before and after surgical debridement.   15 cm x 15 cm   X 5 cm            20 cm x 20 cm x 8 cm    5.  Area and depth of devitalized tissue removed from wound.  400 square cm   And   8 cm   6.  Blood loss and description of tissue removed.  Minimal frankly necrotic skin and fat cultures taken  7.  Evidence of the progress of the wound's response to treatment.  A.  Current wound volume (current dimensions and depth).  20 x 20  X 8 cm  B.  Presence (and extent of) of infection.  Yes extensive  C.  Presence (and extent of) of non viable tissue.  Yes extensive  D.  Other material in the wound that is expected to inhibit healing.  none  8.  Was there any viable tissue removed (measurements): no     Anesthesia general   EBL minimal   Indications: pt presents to ED with necrotizing infection right buttock for 1 week.  It was foul smelling and large   Surgical debridement recommended.   The procedure has been discussed with the patient.  Alternative therapies have been discussed with the patient.  Operative risks include bleeding,  Infection,  Organ injury,  Nerve injury,  Blood vessel injury,  DVT,  Pulmonary embolism,  Death,  And possible reoperation.  Medical management risks include worsening of present situation.  The success of the procedure is 50 -90 % at treating patients symptoms.  The patient understands and agrees to proceed.    Description of procedure:  Pt met in holding area  and questions answered.  She was taken back to the OR and placed supine on OR table.  General anesthesia initiated and pt rolled left side down on bean bag and appropriately  Padded.  Right  Buttock prepped and drapped in a sterile  Fashion and timeout done.  She was on preop ABX.  Right buttock was debrided sharply as described above.  This tracted toward her rectum.  There was no fecal drainage at this point and no sharp dissection used in this area.   Full thickness debridement of buttock done to healthy fat.  No muscle was involved.   Hemostasis  Achieved and packed with saline soaked kerlex.  Pt placed supine,  Extubated in satisfactory condition.  All counts correct. Pt taken to PACU in stable condition.

## 2014-11-29 NOTE — Anesthesia Postprocedure Evaluation (Signed)
  Anesthesia Post-op Note  Patient: Amber Pham  Procedure(s) Performed: Procedure(s) (LRB): INCISION AND DRAINAGE ABSCESS (N/A)  Patient Location: PACU  Anesthesia Type: General  Level of Consciousness: awake and alert   Airway and Oxygen Therapy: Patient Spontanous Breathing  Post-op Pain: mild  Post-op Assessment: Post-op Vital signs reviewed, Patient's Cardiovascular Status Stable, Respiratory Function Stable, Patent Airway and No signs of Nausea or vomiting  Last Vitals:  Filed Vitals:   11/29/14 1528  BP: 118/61  Pulse: 100  Temp: 36.5 C  Resp: 22    Post-op Vital Signs: stable   Complications: No apparent anesthesia complications

## 2014-11-29 NOTE — ED Notes (Addendum)
Pt from home c/o and a skin abcess on buttock since last week. Foul smelling drainage.

## 2014-11-29 NOTE — Anesthesia Preprocedure Evaluation (Addendum)
Anesthesia Evaluation  Patient identified by MRN, date of birth, ID band Patient awake    Reviewed: Allergy & Precautions, NPO status , Patient's Chart, lab work & pertinent test results  Airway Mallampati: II  TM Distance: >3 FB Neck ROM: Full    Dental  (+) Edentulous Upper, Partial Lower, Dental Advisory Given   Pulmonary neg pulmonary ROS,  breath sounds clear to auscultation  Pulmonary exam normal       Cardiovascular negative cardio ROS  Rhythm:Regular Rate:Normal     Neuro/Psych negative neurological ROS  negative psych ROS   GI/Hepatic Neg liver ROS, GERD-  Medicated,  Endo/Other  Morbid obesity  Renal/GU negative Renal ROS  negative genitourinary   Musculoskeletal negative musculoskeletal ROS (+)   Abdominal (+) + obese,   Peds negative pediatric ROS (+)  Hematology negative hematology ROS (+)   Anesthesia Other Findings   Reproductive/Obstetrics negative OB ROS                            Anesthesia Physical Anesthesia Plan  ASA: III and emergent  Anesthesia Plan: General   Post-op Pain Management:    Induction: Intravenous  Airway Management Planned: Oral ETT  Additional Equipment:   Intra-op Plan:   Post-operative Plan: Extubation in OR  Informed Consent: I have reviewed the patients History and Physical, chart, labs and discussed the procedure including the risks, benefits and alternatives for the proposed anesthesia with the patient or authorized representative who has indicated his/her understanding and acceptance.   Dental advisory given  Plan Discussed with: CRNA  Anesthesia Plan Comments: (Lateral positioning , plan ETT)        Anesthesia Quick Evaluation

## 2014-11-29 NOTE — ED Provider Notes (Signed)
CSN: [id number]     Arrival date & time 11/29/14  7829 History   First MD Initiated Contact with Patient 11/29/14 1000     Chief Complaint  Patient presents with  . Recurrent Skin Infections     (Consider location/radiation/quality/duration/timing/severity/associated sxs/prior Treatment) The history is provided by the patient.     Amber Pham is a 64 y.o. female who does not receive any medical care.  Currently, and presents for evaluation of a sore of the right buttocks.  It started out as a small reddened area, but now has gotten larger, become draining, and has some areas of black discoloration.  She complains of general weakness, but denies fever, chills, nausea or vomiting.  There's been no cough, chest pain or shortness of breath.  She has never had this problem previously.  She came here ambulatory, by private vehicle.  There are no other known modifying factors.  History reviewed. No pertinent past medical history. History reviewed. No pertinent past surgical history. No family history on file. History  Substance Use Topics  . Smoking status: Never Smoker   . Smokeless tobacco: Not on file  . Alcohol Use: No   OB History    No data available     Review of Systems  All other systems reviewed and are negative.     Allergies  Review of patient's allergies indicates no known allergies.  Home Medications   Prior to Admission medications   Not on File   BP 109/61 mmHg  Pulse 137  Temp(Src) 97.7 F (36.5 C) (Oral)  Resp 25  SpO2 96% Physical Exam  Constitutional: She is oriented to person, place, and time. She appears well-developed. No distress (nontoxic appearance).  Morbidly obese  HENT:  Head: Normocephalic and atraumatic.  Right Ear: External ear normal.  Left Ear: External ear normal.  Eyes: Conjunctivae and EOM are normal. Pupils are equal, round, and reactive to light.  Neck: Normal range of motion and phonation normal. Neck supple.  Cardiovascular:  Normal rate, regular rhythm and normal heart sounds.   Pulmonary/Chest: Effort normal and breath sounds normal. She exhibits no bony tenderness.  Abdominal: Soft. There is no tenderness.  Musculoskeletal: Normal range of motion.  There is moderate tenderness of the right buttock, to palpation, and with movement.  Neurological: She is alert and oriented to person, place, and time. No cranial nerve deficit or sensory deficit. She exhibits normal muscle tone. Coordination normal.  Skin: Skin is warm, dry and intact.  Large, greater than 15 x 15 cm, fluctuant mass, right buttocks.  There is surrounding induration, and erythema and central necrotic tissue, measuring approximately 10 x 10 cm.  The wound is diffusely draining, and has a very malodorous drainage.  Psychiatric: She has a normal mood and affect. Her behavior is normal. Judgment and thought content normal.  Nursing note and vitals reviewed.   ED Course  Procedures (including critical care time)  10:15- immediate treatment for severe infection with probable necrotizing fasciitis, begun with IV fluids, and  empiric antibiotics ordered.  Will screen with blood cultures, and lactate, before initiating antibiotic treatment.  Will consult general surgery to discuss possible operative management, versus further evaluation with imaging prior to drainage procedures.  This wound would be easily drainable at least superficially, but the deep nature of it may require extensive operative intervention.  Medications  sodium chloride 0.9 % bolus 1,000 mL (not administered)    And  sodium chloride 0.9 % bolus 1,000 mL (not administered)  Patient Vitals for the past 24 hrs:  BP Temp Temp src Pulse Resp SpO2 Weight  11/29/14 1016 - - - - - - (!) 330 lb (149.687 kg)  11/29/14 0958 109/61 mmHg 97.7 F (36.5 C) Oral (!) 137 25 96 % -    10:20- Discussed with Dr. Luisa Hart who agreed to see pt., in ED to consider intervention. He came, and decided to  take pt., to the OR for treatment.   Labs Review Labs Reviewed  COMPREHENSIVE METABOLIC PANEL - Abnormal; Notable for the following:    Sodium 131 (*)    Potassium 3.3 (*)    Glucose, Bld 125 (*)    BUN 61 (*)    Creatinine, Ser 2.23 (*)    Albumin 2.4 (*)    AST 120 (*)    Total Bilirubin 1.8 (*)    GFR calc non Af Amer 22 (*)    GFR calc Af Amer 26 (*)    All other components within normal limits  URINALYSIS, ROUTINE W REFLEX MICROSCOPIC - Abnormal; Notable for the following:    Color, Urine AMBER (*)    APPearance CLOUDY (*)    Hgb urine dipstick MODERATE (*)    Bilirubin Urine SMALL (*)    Protein, ur 100 (*)    Urobilinogen, UA 4.0 (*)    All other components within normal limits  CBC WITH DIFFERENTIAL/PLATELET - Abnormal; Notable for the following:    WBC 28.4 (*)    RBC 3.72 (*)    Hemoglobin 11.6 (*)    HCT 32.6 (*)    Neutrophils Relative % 85 (*)    Lymphocytes Relative 8 (*)    Neutro Abs 24.1 (*)    Monocytes Absolute 1.7 (*)    Basophils Absolute 0.3 (*)    All other components within normal limits  HEMOGLOBIN A1C - Abnormal; Notable for the following:    Hgb A1c MFr Bld 6.0 (*)    Mean Plasma Glucose 126 (*)    All other components within normal limits  GLUCOSE, CAPILLARY - Abnormal; Notable for the following:    Glucose-Capillary 106 (*)    All other components within normal limits  GLUCOSE, CAPILLARY - Abnormal; Notable for the following:    Glucose-Capillary 102 (*)    All other components within normal limits  CBC - Abnormal; Notable for the following:    WBC 25.4 (*)    RBC 3.21 (*)    Hemoglobin 9.7 (*)    HCT 28.8 (*)    All other components within normal limits  BASIC METABOLIC PANEL - Abnormal; Notable for the following:    Potassium 3.2 (*)    Glucose, Bld 145 (*)    BUN 55 (*)    Creatinine, Ser 2.09 (*)    Calcium 8.1 (*)    GFR calc non Af Amer 24 (*)    GFR calc Af Amer 28 (*)    All other components within normal limits   GLUCOSE, CAPILLARY - Abnormal; Notable for the following:    Glucose-Capillary 127 (*)    All other components within normal limits  URINE MICROSCOPIC-ADD ON - Abnormal; Notable for the following:    Crystals URIC ACID CRYSTALS (*)    All other components within normal limits  GLUCOSE, CAPILLARY - Abnormal; Notable for the following:    Glucose-Capillary 138 (*)    All other components within normal limits  GLUCOSE, CAPILLARY - Abnormal; Notable for the following:    Glucose-Capillary 120 (*)  All other components within normal limits  GLUCOSE, CAPILLARY - Abnormal; Notable for the following:    Glucose-Capillary 130 (*)    All other components within normal limits  I-STAT CG4 LACTIC ACID, ED - Abnormal; Notable for the following:    Lactic Acid, Venous 3.24 (*)    All other components within normal limits  ANAEROBIC CULTURE  CULTURE, ROUTINE-ABSCESS  CULTURE, BLOOD (ROUTINE X 2)  CULTURE, BLOOD (ROUTINE X 2)  URINE CULTURE  PROCALCITONIN  CBC WITH DIFFERENTIAL/PLATELET  Rosezena SensorI-STAT TROPOININ, ED    Imaging Review Dg Chest Port 1 View  11/29/2014   CLINICAL DATA:  Buttock abscess with foul drainage for 1 week. Weakness. Initial encounter.  EXAM: PORTABLE CHEST - 1 VIEW  COMPARISON:  None.  FINDINGS: 1043 hr. There is asymmetric elevation/ eventration of the right hemidiaphragm associate with mild right basilar atelectasis. There is no confluent airspace opacity, edema or significant pleural effusion. The heart size and mediastinal contours are normal. No osseous abnormalities are seen. Telemetry leads overlie the chest and upper abdomen.  IMPRESSION: No acute findings. Right basilar atelectasis adjacent to elevated/ eventration of the right hemidiaphragm   Electronically Signed   By: Roxy HorsemanBill  Veazey M.D.   On: 11/29/2014 11:01     EKG Interpretation None      MDM   Final diagnoses:  Abscess of right buttock    Deep tissue buttocks with necrosis. New renal insufficiency. Pt.,  required urgent OR procedure to drain abscess.  Nursing Notes Reviewed/ Care Coordinated Applicable Imaging Reviewed Interpretation of Laboratory Data incorporated into ED treatment   Plan: To OR, then admit.    Flint MelterElliott L Delane Stalling, MD 11/30/14 [phone]

## 2014-11-29 NOTE — Progress Notes (Signed)
ANTIBIOTIC CONSULT NOTE - INITIAL  Pharmacy Consult for Vancomcyin, Zosyn Indication: Necrotizing right buttock abscess/infection, Code Sepsis  No Known Allergies  Patient Measurements: Weight: (!) 330 lb (149.687 kg)   Vital Signs: Temp: 97.7 F (36.5 C) (01/24 0958) Temp Source: Oral (01/24 0958) BP: 109/61 mmHg (01/24 0958) Pulse Rate: 137 (01/24 0958) Intake/Output from previous day:   Intake/Output from this shift:    Labs: No results for input(s): WBC, HGB, PLT, LABCREA, CREATININE in the last 72 hours. CrCl cannot be calculated (Unknown ideal weight.). No results for input(s): VANCOTROUGH, VANCOPEAK, VANCORANDOM, GENTTROUGH, GENTPEAK, GENTRANDOM, TOBRATROUGH, TOBRAPEAK, TOBRARND, AMIKACINPEAK, AMIKACINTROU, AMIKACIN in the last 72 hours.   Microbiology: No results found for this or any previous visit (from the past 720 hour(s)).  Medical History: History reviewed. No pertinent past medical history.   Assessment: [age] y/oF who [facility] evaluation of sore of the right buttocks, progressively getting larger, having a malodorous drainage, and with some areas of black discoloration.  Code sepsis initiated. MD indicates this is a necrotizing abscess/infection, and patient emergently taken to OR for surgical debridement.  Pharmacy consulted to assist with dosing of Vancomycin and Zosyn for this patient.   1/24 >> Vancomycin >> 1/24 >> Zosyn >>    Tmax: 97.7 F WBCs: elevated, 28.4K Renal: SCr 2.23, CrCl ~ 29 mL/min Normalized PCT: 14.76 Lactic acid: 3.24  1/24 blood x 2: sent 1/24 urine: ordered  1/24 abscess: sent   Goal of Therapy:  Vancomycin trough level 15-20 mcg/ml  Appropriate antibiotic dosing for renal function and indication Eradication of infection  Plan:   Zosyn 3.375 grams IV x 1 over 30 minutes STAT (given in ED), then 3.375g IV q8h (infuse over 4 hours).  Vancomycin 2500 mg IV loading dose x 1 STAT ordered while in ED (given in  OR), then 1750 mg IV q24h.  Plan for Vancomycin trough level at steady state.  Follow-up renal function, cultures, clinical course.   Greer PickerelJigna Thecla Forgione, PharmD, BCPS Pager: [phone] 11/29/2014 3:49 PM

## 2014-11-29 NOTE — ED Notes (Signed)
Sue Lushndrea called from OR requesting pt. Amber Pham, NT to transport

## 2014-11-30 ENCOUNTER — Encounter (HOSPITAL_COMMUNITY)
Admission: EM | Disposition: A | Discharge status: Nursing Home (Includes ICF) | Payer: Self-pay | Source: Home / Self Care

## 2014-11-30 ENCOUNTER — Inpatient Hospital Stay (HOSPITAL_COMMUNITY): Payer: No Typology Code available for payment source | Admitting: Anesthesiology

## 2014-11-30 ENCOUNTER — Encounter (HOSPITAL_COMMUNITY): Payer: Self-pay | Admitting: Surgery

## 2014-11-30 HISTORY — PX: INCISION AND DRAINAGE OF WOUND: SHX1803

## 2014-11-30 LAB — CBC WITH DIFFERENTIAL/PLATELET
Basophils Absolute: 0.3 10*3/uL — ABNORMAL HIGH (ref 0.0–0.1)
Basophils Relative: 1 % (ref 0–1)
EOS ABS: 0 10*3/uL (ref 0.0–0.7)
EOS PCT: 0 % (ref 0–5)
HCT: 27.2 % — ABNORMAL LOW (ref 36.0–46.0)
Hemoglobin: 9 g/dL — ABNORMAL LOW (ref 12.0–15.0)
LYMPHS ABS: 3.7 10*3/uL (ref 0.7–4.0)
Lymphocytes Relative: 14 % (ref 12–46)
MCH: 30.2 pg (ref 26.0–34.0)
MCHC: 33.1 g/dL (ref 30.0–36.0)
MCV: 91.3 fL (ref 78.0–100.0)
Monocytes Absolute: 0.3 10*3/uL (ref 0.1–1.0)
Monocytes Relative: 1 % — ABNORMAL LOW (ref 3–12)
Neutro Abs: 21.9 10*3/uL — ABNORMAL HIGH (ref 1.7–7.7)
Neutrophils Relative %: 84 % — ABNORMAL HIGH (ref 43–77)
Platelets: 218 10*3/uL (ref 150–400)
RBC: 2.98 MIL/uL — ABNORMAL LOW (ref 3.87–5.11)
RDW: 14.6 % (ref 11.5–15.5)
WBC: 26.2 10*3/uL — ABNORMAL HIGH (ref 4.0–10.5)

## 2014-11-30 LAB — CBC
HCT: 28.8 % — ABNORMAL LOW (ref 36.0–46.0)
HEMATOCRIT: 25.5 % — AB (ref 36.0–46.0)
Hemoglobin: 9.7 g/dL — ABNORMAL LOW (ref 12.0–15.0)
Hemoglobin: 8.6 g/dL — ABNORMAL LOW (ref 12.0–15.0)
MCH: 30.2 pg (ref 26.0–34.0)
MCH: 30.5 pg (ref 26.0–34.0)
MCHC: 33.7 g/dL (ref 30.0–36.0)
MCHC: 33.7 g/dL (ref 30.0–36.0)
MCV: 89.7 fL (ref 78.0–100.0)
MCV: 90.4 fL (ref 78.0–100.0)
Platelets: 249 10*3/uL (ref 150–400)
Platelets: 258 10*3/uL (ref 150–400)
RBC: 3.21 MIL/uL — ABNORMAL LOW (ref 3.87–5.11)
RBC: 2.82 MIL/uL — ABNORMAL LOW (ref 3.87–5.11)
RDW: 14.4 % (ref 11.5–15.5)
RDW: 14.6 % (ref 11.5–15.5)
WBC: 25.4 10*3/uL — AB (ref 4.0–10.5)
WBC: 27.9 10*3/uL — ABNORMAL HIGH (ref 4.0–10.5)

## 2014-11-30 LAB — URINE CULTURE
CULTURE: NO GROWTH
Colony Count: NO GROWTH

## 2014-11-30 LAB — GLUCOSE, CAPILLARY
GLUCOSE-CAPILLARY: 142 mg/dL — AB (ref 70–99)
GLUCOSE-CAPILLARY: 113 mg/dL — AB (ref 70–99)
GLUCOSE-CAPILLARY: 80 mg/dL (ref 70–99)
Glucose-Capillary: 130 mg/dL — ABNORMAL HIGH (ref 70–99)
Glucose-Capillary: 182 mg/dL — ABNORMAL HIGH (ref 70–99)

## 2014-11-30 LAB — BASIC METABOLIC PANEL
ANION GAP: 12 (ref 5–15)
ANION GAP: 11 (ref 5–15)
BUN: 55 mg/dL — ABNORMAL HIGH (ref 6–23)
BUN: 50 mg/dL — ABNORMAL HIGH (ref 6–23)
CALCIUM: 8.1 mg/dL — AB (ref 8.4–10.5)
CHLORIDE: 103 mmol/L (ref 96–112)
CO2: 23 mmol/L (ref 19–32)
CO2: 24 mmol/L (ref 19–32)
CREATININE: 2.09 mg/dL — AB (ref 0.50–1.10)
Calcium: 8.1 mg/dL — ABNORMAL LOW (ref 8.4–10.5)
Chloride: 100 mmol/L (ref 96–112)
Creatinine, Ser: 2.04 mg/dL — ABNORMAL HIGH (ref 0.50–1.10)
GFR calc Af Amer: 29 mL/min — ABNORMAL LOW (ref >90)
GFR calc non Af Amer: 25 mL/min — ABNORMAL LOW (ref >90)
GFR, EST AFRICAN AMERICAN: 28 mL/min — AB (ref >90)
GFR, EST NON AFRICAN AMERICAN: 24 mL/min — AB (ref >90)
GLUCOSE: 151 mg/dL — AB (ref 70–99)
Glucose, Bld: 145 mg/dL — ABNORMAL HIGH (ref 70–99)
Potassium: 3.2 mmol/L — ABNORMAL LOW (ref 3.5–5.1)
Potassium: 3.7 mmol/L (ref 3.5–5.1)
Sodium: 135 mmol/L (ref 135–145)
Sodium: 138 mmol/L (ref 135–145)

## 2014-11-30 LAB — SURGICAL PCR SCREEN
MRSA, PCR: NEGATIVE
STAPHYLOCOCCUS AUREUS: NEGATIVE

## 2014-11-30 LAB — LACTIC ACID, PLASMA: Lactic Acid, Venous: 1.9 mmol/L (ref 0.5–2.0)

## 2014-11-30 SURGERY — IRRIGATION AND DEBRIDEMENT WOUND
Anesthesia: General | Site: Buttocks | Laterality: Right

## 2014-11-30 MED ORDER — LACTATED RINGERS IV SOLN
INTRAVENOUS | Status: DC | PRN
  Administered 2014-11-30 (×3): via INTRAVENOUS

## 2014-11-30 MED ORDER — PHENYLEPHRINE HCL 10 MG/ML IJ SOLN
INTRAMUSCULAR | Status: DC | PRN
  Administered 2014-11-30: 160 ug via INTRAVENOUS
  Administered 2014-11-30 (×5): 80 ug via INTRAVENOUS

## 2014-11-30 MED ORDER — LIDOCAINE HCL (CARDIAC) 20 MG/ML IV SOLN
INTRAVENOUS | Status: AC
  Filled 2014-11-30: qty 5

## 2014-11-30 MED ORDER — VITAMINS A & D EX OINT
TOPICAL_OINTMENT | CUTANEOUS | Status: AC
  Filled 2014-11-30: qty 5

## 2014-11-30 MED ORDER — SODIUM CHLORIDE 0.9 % IR SOLN
Status: DC | PRN
  Administered 2014-11-30: 6000 mL

## 2014-11-30 MED ORDER — FENTANYL CITRATE 0.05 MG/ML IJ SOLN
INTRAMUSCULAR | Status: DC | PRN
  Administered 2014-11-30: 50 ug via INTRAVENOUS

## 2014-11-30 MED ORDER — PROPOFOL 10 MG/ML IV BOLUS
INTRAVENOUS | Status: DC | PRN
  Administered 2014-11-30: 200 mg via INTRAVENOUS

## 2014-11-30 MED ORDER — ADULT MULTIVITAMIN W/MINERALS CH
1.0000 | ORAL_TABLET | Freq: Every day | ORAL | Status: DC
  Administered 2014-11-30 – 2014-12-07 (×8): 1 via ORAL
  Filled 2014-11-30 (×8): qty 1

## 2014-11-30 MED ORDER — LIDOCAINE HCL (CARDIAC) 20 MG/ML IV SOLN
INTRAVENOUS | Status: DC | PRN
  Administered 2014-11-30: 100 mg via INTRAVENOUS

## 2014-11-30 MED ORDER — ATROPINE SULFATE 0.4 MG/ML IJ SOLN
INTRAMUSCULAR | Status: DC | PRN
  Administered 2014-11-30: 0.4 mg via INTRAVENOUS

## 2014-11-30 MED ORDER — ONDANSETRON HCL 4 MG/2ML IJ SOLN
INTRAMUSCULAR | Status: DC | PRN
Start: 2014-11-30 — End: 2014-11-30
  Administered 2014-11-30: 4 mg via INTRAVENOUS

## 2014-11-30 MED ORDER — ATROPINE SULFATE 0.1 MG/ML IJ SOLN
INTRAMUSCULAR | Status: AC
  Filled 2014-11-30: qty 10

## 2014-11-30 MED ORDER — MIDAZOLAM HCL 5 MG/5ML IJ SOLN
INTRAMUSCULAR | Status: DC | PRN
  Administered 2014-11-30: 2 mg via INTRAVENOUS

## 2014-11-30 MED ORDER — ENOXAPARIN SODIUM 80 MG/0.8ML ~~LOC~~ SOLN
75.0000 mg | SUBCUTANEOUS | Status: DC
  Administered 2014-12-01: 75 mg via SUBCUTANEOUS
  Filled 2014-11-30 (×2): qty 0.8

## 2014-11-30 MED ORDER — SUCCINYLCHOLINE CHLORIDE 20 MG/ML IJ SOLN
INTRAMUSCULAR | Status: DC | PRN
  Administered 2014-11-30: 100 mg via INTRAVENOUS

## 2014-11-30 MED ORDER — FENTANYL CITRATE 0.05 MG/ML IJ SOLN
INTRAMUSCULAR | Status: AC
  Filled 2014-11-30: qty 5

## 2014-11-30 MED ORDER — MEPERIDINE HCL 50 MG/ML IJ SOLN: 6.2500 mg | INTRAMUSCULAR | Status: DC | PRN

## 2014-11-30 MED ORDER — MIDAZOLAM HCL 2 MG/2ML IJ SOLN
INTRAMUSCULAR | Status: AC
  Filled 2014-11-30: qty 2

## 2014-11-30 MED ORDER — PROPOFOL 10 MG/ML IV BOLUS
INTRAVENOUS | Status: AC
  Filled 2014-11-30: qty 20

## 2014-11-30 MED ORDER — EPHEDRINE SULFATE 50 MG/ML IJ SOLN
INTRAMUSCULAR | Status: DC | PRN
  Administered 2014-11-30: 15 mg via INTRAVENOUS

## 2014-11-30 MED ORDER — PRO-STAT SUGAR FREE PO LIQD
30.0000 mL | Freq: Three times a day (TID) | ORAL | Status: DC
  Administered 2014-11-30 – 2014-12-07 (×19): 30 mL via ORAL
  Filled 2014-11-30 (×27): qty 30

## 2014-11-30 MED ORDER — PROMETHAZINE HCL 25 MG/ML IJ SOLN: 6.2500 mg | INTRAMUSCULAR | Status: DC | PRN

## 2014-11-30 MED ORDER — FENTANYL CITRATE 0.05 MG/ML IJ SOLN: 25.0000 ug | INTRAMUSCULAR | Status: DC | PRN

## 2014-11-30 SURGICAL SUPPLY — 39 items
BLADE HEX COATED 2.75 (ELECTRODE) ×3 IMPLANT
BLADE SURG 15 STRL LF DISP TIS (BLADE) ×1 IMPLANT
BLADE SURG 15 STRL SS (BLADE) ×2
BNDG GAUZE ELAST 4 BULKY (GAUZE/BANDAGES/DRESSINGS) ×3 IMPLANT
BRIEF STRETCH FOR OB PAD LRG (UNDERPADS AND DIAPERS) IMPLANT
DECANTER SPIKE VIAL GLASS SM (MISCELLANEOUS) ×3 IMPLANT
DRAPE LAPAROTOMY T 102X78X121 (DRAPES) IMPLANT
ELECT REM PT RETURN 9FT ADLT (ELECTROSURGICAL) ×3
ELECTRODE REM PT RTRN 9FT ADLT (ELECTROSURGICAL) ×1 IMPLANT
GAUZE SPONGE 4X4 12PLY STRL (GAUZE/BANDAGES/DRESSINGS) IMPLANT
GAUZE SPONGE 4X4 16PLY XRAY LF (GAUZE/BANDAGES/DRESSINGS) IMPLANT
GLOVE BIO SURGEON STRL SZ 6 (GLOVE) ×6 IMPLANT
GLOVE BIOGEL PI IND STRL 6.5 (GLOVE) ×1 IMPLANT
GLOVE BIOGEL PI IND STRL 7.0 (GLOVE) ×1 IMPLANT
GLOVE BIOGEL PI INDICATOR 6.5 (GLOVE) ×2
GLOVE BIOGEL PI INDICATOR 7.0 (GLOVE) ×2
GLOVE INDICATOR 6.5 STRL GRN (GLOVE) ×6 IMPLANT
GOWN SPEC L4 XLG W/TWL (GOWN DISPOSABLE) ×9 IMPLANT
GOWN STRL REUS W/ TWL XL LVL3 (GOWN DISPOSABLE) ×4 IMPLANT
GOWN STRL REUS W/TWL 2XL LVL3 (GOWN DISPOSABLE) ×3 IMPLANT
GOWN STRL REUS W/TWL LRG LVL3 (GOWN DISPOSABLE) ×3 IMPLANT
GOWN STRL REUS W/TWL XL LVL3 (GOWN DISPOSABLE) ×8
KIT BASIN OR (CUSTOM PROCEDURE TRAY) ×3 IMPLANT
NEEDLE HYPO 22GX1.5 SAFETY (NEEDLE) ×3 IMPLANT
PACK BASIC VI WITH GOWN DISP (CUSTOM PROCEDURE TRAY) IMPLANT
PACK LITHOTOMY IV (CUSTOM PROCEDURE TRAY) ×3 IMPLANT
PAD ABD 8X10 STRL (GAUZE/BANDAGES/DRESSINGS) ×3 IMPLANT
PENCIL BUTTON HOLSTER BLD 10FT (ELECTRODE) ×3 IMPLANT
SHEARS HARMONIC 9CM CVD (BLADE) IMPLANT
SPONGE LAP 18X18 X RAY DECT (DISPOSABLE) ×15 IMPLANT
SPONGE SURGIFOAM ABS GEL 100 (HEMOSTASIS) ×3 IMPLANT
SUT CHROMIC 3 0 SH 27 (SUTURE) IMPLANT
SUT VIC AB 3-0 SH 27 (SUTURE)
SUT VIC AB 3-0 SH 27XBRD (SUTURE) IMPLANT
SYR BULB IRRIGATION 50ML (SYRINGE) IMPLANT
SYR CONTROL 10ML LL (SYRINGE) ×3 IMPLANT
TIP HIGH FLOW IRRIGATION COAX (MISCELLANEOUS) ×6 IMPLANT
TOWEL OR 17X26 10 PK STRL BLUE (TOWEL DISPOSABLE) ×9 IMPLANT
YANKAUER SUCT BULB TIP 10FT TU (MISCELLANEOUS) ×3 IMPLANT

## 2014-11-30 NOTE — Progress Notes (Deleted)
ANTICOAGULATION CONSULT NOTE - Initial Consult  Pharmacy Consult for enoxaparin Indication: dvt prophylaxis  No Known Allergies  Patient Measurements: Height: 5\' 4"  (162.6 cm) Weight: (!) 333 lb (151.048 kg) IBW/kg (Calculated) : 54.7   Vital Signs: Temp: 98.5 F (36.9 C) (01/25 1400) Temp Source: Oral (01/25 0655) BP: 103/62 mmHg (01/25 1330) Pulse Rate: 105 (01/25 1330)  Labs:  Recent Labs  11/29/14 1010 11/29/14 1013 11/30/14 0444 11/30/14 1208  HGB 11.6*  --  9.7* 9.0*  HCT 32.6*  --  28.8* 27.2*  PLT 296  --  249 218  CREATININE  --  2.23* 2.09*  --     Estimated Creatinine Clearance: 40.5 mL/min (by C-G formula based on Cr of 2.09).   Medical History: History reviewed. No pertinent past medical history.  Assessment: 64 y.o. Female with right buttock abscess, pharmacy consulted to dose enoxaparin for dvt prophylaxis.  Scr 2.1, CrCl ~ 40.355mls/min Hgb 9, Hct 27.2 Plt WNL  Goal of Therapy:  Monitor platelets by anticoagulation protocol    Plan:   Enoxaparin 75mg  (0.5mg /kg) sq q24h  CBC q72 hours  Follow renal function   Arley Phenixllen Presly Steinruck RPh 11/30/2014, 2:19 PM Pager [phone]

## 2014-11-30 NOTE — Anesthesia Preprocedure Evaluation (Addendum)
Anesthesia Evaluation  Patient identified by MRN, date of birth, ID band Patient awake    Reviewed: Allergy & Precautions, NPO status , Patient's Chart, lab work & pertinent test results, reviewed documented beta blocker date and time   Airway Mallampati: III   Neck ROM: Full    Dental  (+) Edentulous Upper, Missing   Pulmonary  Probable OSA 2nd to morbid obesity BMI 57         Cardiovascular Rhythm:Regular     Neuro/Psych    GI/Hepatic Neg liver ROS, GERD-  ,  Endo/Other  diabetes  Renal/GU Renal InsufficiencyRenal disease gfr 24     Musculoskeletal   Abdominal (+) + obese,   Peds  Hematology 9/28 WBC 24K   Anesthesia Other Findings Short neclk  Reproductive/Obstetrics                            Anesthesia Physical Anesthesia Plan  ASA: IV  Anesthesia Plan: General   Post-op Pain Management:    Induction: Intravenous  Airway Management Planned: Oral ETT  Additional Equipment:   Intra-op Plan:   Post-operative Plan: Extubation in OR  Informed Consent: I have reviewed the patients History and Physical, chart, labs and discussed the procedure including the risks, benefits and alternatives for the proposed anesthesia with the patient or authorized representative who has indicated his/her understanding and acceptance.     Plan Discussed with:   Anesthesia Plan Comments: (WBC 25K, BMI 57, anemic 9/28, Sepsis percautions)        Anesthesia Quick Evaluation

## 2014-11-30 NOTE — Transfer of Care (Signed)
Immediate Anesthesia Transfer of Care Note  Patient: Amber Pham  Procedure(s) Performed: Procedure(s): IRRIGATION AND DEBRIDEMENT BUTTOCK WOUND (Right)  Patient Location: PACU  Anesthesia Type:General  Level of Consciousness: sedated  Airway & Oxygen Therapy: Patient Spontanous Breathing and Patient connected to face mask oxygen  Post-op Assessment: Report given to PACU RN and Post -op Vital signs reviewed and stable  Post vital signs: Reviewed and stable  Complications: No apparent anesthesia complications

## 2014-11-30 NOTE — Progress Notes (Signed)
1 Day Post-Op  Subjective: She has never really been sick.  She is retired from [employer].  She has a hard time moving, but was able to get up and around room earlier.  Objective: Vital signs in last 24 hours: Temp:  [97.4 F (36.3 C)-101.1 F (38.4 C)] 98.1 F (36.7 C) (01/25 0655) Pulse Rate:  [90-137] 96 (01/25 0655) Resp:  [18-38] 18 (01/25 0655) BP: (94-135)/(43-73) 116/60 mmHg (01/25 0655) SpO2:  [92 %-100 %] 97 % (01/25 0655) Weight:  [149.687 kg (330 lb)-151.048 kg (333 lb)] 151.048 kg (333 lb) (01/24 1555) Last BM Date: 11/27/14 Tm 101.1 VSS WBC 25.4Creatinine is 2.09 No records prior to this Admit, except a colonoscopy 05/2007. Intake/Output from previous day: 01/24 0701 - 01/25 0700 In: 2979.2 [I.V.:2879.2; IV Piggyback:100] Out: 1950 [Urine:1950] Intake/Output this shift:    General appearance: alert, cooperative and no distress Resp: clear to auscultation bilaterally taking dressing down after she has pain med.  Lab Results:   Recent Labs  11/29/14 1010 11/30/14 0444  WBC 28.4* 25.4*  HGB 11.6* 9.7*  HCT 32.6* 28.8*  PLT 296 249    BMET  Recent Labs  11/29/14 1013 11/30/14 0444  NA 131* 135  K 3.3* 3.2*  CL 96 100  CO2 21 23  GLUCOSE 125* 145*  BUN 61* 55*  CREATININE 2.23* 2.09*  CALCIUM 8.5 8.1*   PT/INR No results for input(s): LABPROT, INR in the last 72 hours.   Recent Labs Lab 11/29/14 1013  AST 120*  ALT 27  ALKPHOS 109  BILITOT 1.8*  PROT 7.0  ALBUMIN 2.4*     Lipase  No results found for: LIPASE   Studies/Results: Dg Chest Port 1 View  11/29/2014   CLINICAL DATA:  Buttock abscess with foul drainage for 1 week. Weakness. Initial encounter.  EXAM: PORTABLE CHEST - 1 VIEW  COMPARISON:  None.  FINDINGS: 1043 hr. There is asymmetric elevation/ eventration of the right hemidiaphragm associate with mild right basilar atelectasis. There is no confluent airspace opacity, edema or significant pleural effusion. The heart size  and mediastinal contours are normal. No osseous abnormalities are seen. Telemetry leads overlie the chest and upper abdomen.  IMPRESSION: No acute findings. Right basilar atelectasis adjacent to elevated/ eventration of the right hemidiaphragm   Electronically Signed   By: Roxy HorsemanBill  Veazey M.D.   On: 11/29/2014 11:01    Medications: . enoxaparin (LOVENOX) injection  40 mg Subcutaneous Daily  . feeding supplement (RESOURCE BREEZE)  1 Container Oral TID BM  . insulin aspart  0-15 Units Subcutaneous 6 times per day  . piperacillin-tazobactam (ZOSYN)  IV  3.375 g Intravenous 3 times per day  . vancomycin  1,750 mg Intravenous Q24H  . vancomycin  2,500 mg Intravenous STAT   . dextrose 5 % and 0.9% NaCl 125 mL/hr at 11/30/14 0318   Prior to Admission medications   Medication Sig Start Date End Date Taking? Authorizing Provider  omeprazole (PRILOSEC OTC) 20 MG tablet Take 20 mg by mouth daily.   Yes Historical Provider, MD    Assessment/Plan Sepsis Right buttocks infection with necrotizing fascitis  Debridement full thickness right buttock wound, 20 x 20 X 8 cm, Dr. Harriette Bouillonhomas Cornett, 11/29/14. Acute renal insuffiencey Body mass index is 57.13 kg/(m^2). Hypokalemia  Plan, take dressing down and start dressing changes, continue hydration, continue antibiotics, watch renal function, and I will advance diet.        LOS: 1 day    Giah Fickett 11/30/2014

## 2014-11-30 NOTE — Anesthesia Postprocedure Evaluation (Signed)
  Anesthesia Post-op Note  Patient: Amber Pham  Procedure(s) Performed: Procedure(s): IRRIGATION AND DEBRIDEMENT BUTTOCK WOUND (Right)  Patient Location: PACU  Anesthesia Type:General  Level of Consciousness: awake and alert   Airway and Oxygen Therapy: Patient Spontanous Breathing and Patient connected to nasal cannula oxygen  Post-op Pain: mild  Post-op Assessment: Post-op Vital signs reviewed and Patient's Cardiovascular Status Stable  Post-op Vital Signs: Reviewed and stable  Last Vitals:  Filed Vitals:   11/30/14 1236  BP:   Pulse: 113  Temp:   Resp: 24    Complications: No apparent anesthesia complications

## 2014-11-30 NOTE — Progress Notes (Signed)
ANTICOAGULATION CONSULT NOTE - Initial Consult  Pharmacy Consult for enoxaparin Indication: VTE prophylaxis  No Known Allergies  Patient Measurements: Height: 5\' 4"  (162.6 cm) Weight: (!) 333 lb (151.048 kg) IBW/kg (Calculated) : 54.7 Heparin Dosing Weight:   Vital Signs: Temp: 98.5 F (36.9 C) (01/25 1400) Temp Source: Oral (01/25 0655) BP: 103/62 mmHg (01/25 1330) Pulse Rate: 105 (01/25 1330)  Labs:  Recent Labs  11/29/14 1010 11/29/14 1013 11/30/14 0444 11/30/14 1208  HGB 11.6*  --  9.7* 9.0*  HCT 32.6*  --  28.8* 27.2*  PLT 296  --  249 218  CREATININE  --  2.23* 2.09*  --     Estimated Creatinine Clearance: 40.5 mL/min (by C-G formula based on Cr of 2.09).   Medical History: History reviewed. No pertinent past medical history.  Assessment: Amber Pham YOF with R buttock abscess s/p I&D 1/25.  Pharmacy asked to dose enoxaparin for VTE prophylaxis. Currently orders for enoxaparin 40mg  SQ q24h given at ~9pm 1/24   BMI = 57  CBC:  Hgb = 9, pltc WNL  Renal: Scr elevated, est CrCl = 4240ml/min  Goal of Therapy:  Anti-Xa level 0.3-0.6 units/ml 4hrs after LMWH dose given   Plan:   Enoxaparin 75mg  SQ q24h (0.5mg /kg) for BMI > 30  Start first dose at 2200 due to surgery  Follow at distance  Dannielle HuhZeigler, Hortence Charter George 11/30/2014,2:05 PM

## 2014-11-30 NOTE — Progress Notes (Signed)
INITIAL NUTRITION ASSESSMENT  DOCUMENTATION CODES Per approved criteria  -Morbid Obesity   INTERVENTION: - Prostat TID, each supplement provides 100 kcal and 15 g protein.  - Multivitamin with minerals - RD will Continue to monitor  NUTRITION DIAGNOSIS: Increased nutrient needs related to protein as evidenced by wound on buttocks.   Goal: Pt to meet >/= 90% of their estimated nutrition needs   Monitor:  Weight trend, labs, wound healing, po intake, acceptance of supplements  Reason for Assessment: MST  64 y.o. female  Admitting Dx: <principal problem not specified>  ASSESSMENT: Pt complains 1 week history of right buttock pain drainage and foul smelling odor. Has neglected it and no care sought out until today. Complains of right buttock pain, Drainage and foul smell.  - Pt s/p I&D 1/25. - Pt reports poor appetite prior to admission. She was only eating boiled eggs and fruit cups.  - Pt's current diet is heart healthy. She says that she is starting feel hungry for things like popsickles.  - Will order prostat and MVI to promote wound healing.  - No signs of fat or muscle wasting   Labs: K kiw BUN elevated  Height: Ht Readings from Last 1 Encounters:  11/30/14 5\' 4"  (1.626 m)    Weight: Wt Readings from Last 1 Encounters:  11/30/14 331 lb 9.2 oz (150.4 kg)    Ideal Body Weight: 54.7 kg  % Ideal Body Weight: 275%  Wt Readings from Last 10 Encounters:  11/30/14 331 lb 9.2 oz (150.4 kg)   BMI:  Body mass index is 56.89 kg/(m^2).  Estimated Nutritional Needs: Kcal: 1900-2100 Protein: 120-130 g Fluid: 1.9-2.1 L/day  Skin: closed incision on right buttocks  Diet Order: Diet Heart  EDUCATION NEEDS: -Education needs addressed   Intake/Output Summary (Last 24 hours) at 11/30/14 1609 Last data filed at 11/30/14 1500  Gross per 24 hour  Intake 5654.17 ml  Output   2200 ml  Net 3454.17 ml    Last BM: prior to admission   Labs:   Recent  Labs Lab 11/29/14 1013 11/30/14 0444  NA 131* 135  K 3.3* 3.2*  CL 96 100  CO2 21 23  BUN 61* 55*  CREATININE 2.23* 2.09*  CALCIUM 8.5 8.1*  GLUCOSE 125* 145*    CBG (last 3)   Recent Labs  11/30/14 0329 11/30/14 0807 11/30/14 1222  GLUCAP 130* 142* 113*    Scheduled Meds: . enoxaparin (LOVENOX) injection  75 mg Subcutaneous Q24H  . feeding supplement (RESOURCE BREEZE)  1 Container Oral TID BM  . insulin aspart  0-15 Units Subcutaneous 6 times per day  . piperacillin-tazobactam (ZOSYN)  IV  3.375 g Intravenous 3 times per day  . vancomycin  1,750 mg Intravenous Q24H    Continuous Infusions: . dextrose 5 % and 0.9% NaCl 125 mL/hr at 11/30/14 [id number]    History reviewed. No pertinent past medical history.  History reviewed. No pertinent past surgical history.  Emmaline KluverHaley Owen Pratte MS, RD, LDN

## 2014-11-30 NOTE — Progress Notes (Signed)
CBC drawn by lab. 

## 2014-11-30 NOTE — Progress Notes (Signed)
Wound dressing taken down with the following findings:    Obviously necrotic and malodorous.  She will need to go back to the OR for further debridement today.

## 2014-11-30 NOTE — Progress Notes (Signed)
Pt's wound packing, dressing and gown was saturated with a yellowish, foul smelling odor. Packing was hanging out of her surgical incision.  I lightly repacked the wound with kerlex moistened with NS, and applied 2 ABD pads. I placed pt on contact precautions per protocol since there was so much drainage.

## 2014-11-30 NOTE — Op Note (Signed)
PRE-OPERATIVE DIAGNOSIS: necrotic right buttock wound  POST-OPERATIVE DIAGNOSIS:  Same  PROCEDURE:  Procedure(s): Irrigation and debridement of right buttock wound, skin, soft tissue, fascia 3x15x18 cm  SURGEON:  Surgeon(s): Almond LintFaera Meleny Tregoning, MD  Assistant:   Acquanetta ChainNicole Trittschuh, PA-S  ANESTHESIA:   general  DRAINS: none   LOCAL MEDICATIONS USED:  NONE  SPECIMEN:  No Specimen  DISPOSITION OF SPECIMEN:  N/A  COUNTS:  YES  DICTATION: .Dragon Dictation  PLAN OF CARE: back to floor  PATIENT DISPOSITION:  PACU - hemodynamically stable.  FINDINGS:  Necrotic skin, soft tissue, fascia.  Dishwater pus.  Very foul odor.    EBL: 50 mL3  PROCEDURE:  Pt was identified in the holding area and taken to the OR where she was intubated on the stretcher.  She was then placed into the prone position.  Her right buttock was prepped and draped in sterile fashion.  Timeout was performed according to the surgical safety checklist.  When all was correct, we continued.  The wound was green, gray, black, and brown.  Tissue was debrided back to bleeding tissue.  There were several areas of tunneling that were opened.  Tissue was debrided to unroof the tunnels.  The pulse lavage was used to irrigate the wound.  The wound was packed with dilute betadine kerlix x 2.  The patient was flipped back supine onto the stretcher and extubated.  She was taken to the pacu in stable condition.  Counts were correct.

## 2014-12-01 ENCOUNTER — Encounter (HOSPITAL_COMMUNITY): Payer: Self-pay | Admitting: General Surgery

## 2014-12-01 ENCOUNTER — Inpatient Hospital Stay (HOSPITAL_COMMUNITY): Payer: No Typology Code available for payment source

## 2014-12-01 LAB — BASIC METABOLIC PANEL
Anion gap: 6 (ref 5–15)
BUN: 46 mg/dL — AB (ref 6–23)
CO2: 24 mmol/L (ref 19–32)
CREATININE: 1.99 mg/dL — AB (ref 0.50–1.10)
Calcium: 7.6 mg/dL — ABNORMAL LOW (ref 8.4–10.5)
Chloride: 105 mmol/L (ref 96–112)
GFR calc non Af Amer: 26 mL/min — ABNORMAL LOW (ref >90)
GFR, EST AFRICAN AMERICAN: 30 mL/min — AB (ref >90)
Glucose, Bld: 122 mg/dL — ABNORMAL HIGH (ref 70–99)
POTASSIUM: 3.3 mmol/L — AB (ref 3.5–5.1)
SODIUM: 135 mmol/L (ref 135–145)

## 2014-12-01 LAB — CBC
HEMATOCRIT: 21.5 % — AB (ref 36.0–46.0)
Hemoglobin: 7.3 g/dL — ABNORMAL LOW (ref 12.0–15.0)
MCH: 30.4 pg (ref 26.0–34.0)
MCHC: 34 g/dL (ref 30.0–36.0)
MCV: 89.6 fL (ref 78.0–100.0)
Platelets: 246 10*3/uL (ref 150–400)
RBC: 2.4 MIL/uL — ABNORMAL LOW (ref 3.87–5.11)
RDW: 14.7 % (ref 11.5–15.5)
WBC: 27.4 10*3/uL — ABNORMAL HIGH (ref 4.0–10.5)

## 2014-12-01 LAB — HIV ANTIBODY (ROUTINE TESTING W REFLEX)
HIV 1/O/2 Abs-Index Value: <1 (ref <1.00)
HIV-1/HIV-2 Ab: NONREACTIVE

## 2014-12-01 LAB — GLUCOSE, CAPILLARY
GLUCOSE-CAPILLARY: 129 mg/dL — AB (ref 70–99)
GLUCOSE-CAPILLARY: 139 mg/dL — AB (ref 70–99)
GLUCOSE-CAPILLARY: 142 mg/dL — AB (ref 70–99)
Glucose-Capillary: 107 mg/dL — ABNORMAL HIGH (ref 70–99)
Glucose-Capillary: 140 mg/dL — ABNORMAL HIGH (ref 70–99)

## 2014-12-01 LAB — HEPATIC FUNCTION PANEL
ALT: 15 U/L (ref 0–35)
AST: 50 U/L — AB (ref 0–37)
Albumin: 1.7 g/dL — ABNORMAL LOW (ref 3.5–5.2)
Alkaline Phosphatase: 71 U/L (ref 39–117)
BILIRUBIN DIRECT: 0.4 mg/dL (ref 0.0–0.5)
Indirect Bilirubin: 0.4 mg/dL (ref 0.3–0.9)
TOTAL PROTEIN: 5.3 g/dL — AB (ref 6.0–8.3)
Total Bilirubin: 0.8 mg/dL (ref 0.3–1.2)

## 2014-12-01 LAB — HEMOGLOBIN A1C
HEMOGLOBIN A1C: 6.1 % — AB (ref <5.7)
Mean Plasma Glucose: 128 mg/dL — ABNORMAL HIGH (ref <117)

## 2014-12-01 MED ORDER — DAKINS (1/4 STRENGTH) 0.125 % EX SOLN
Freq: Two times a day (BID) | CUTANEOUS | Status: AC
  Administered 2014-12-01: 14:00:00
  Administered 2014-12-02: 1
  Administered 2014-12-02: 14:00:00
  Administered 2014-12-03: 1
  Administered 2014-12-03: 11:00:00
  Filled 2014-12-01 (×3): qty 473

## 2014-12-01 MED ORDER — ENOXAPARIN SODIUM 80 MG/0.8ML ~~LOC~~ SOLN
75.0000 mg | SUBCUTANEOUS | Status: DC
  Administered 2014-12-02: 75 mg via SUBCUTANEOUS
  Filled 2014-12-01 (×2): qty 0.8

## 2014-12-01 MED ORDER — LIDOCAINE HCL 1 % IJ SOLN
INTRAMUSCULAR | Status: AC
  Filled 2014-12-01: qty 20

## 2014-12-01 NOTE — Progress Notes (Signed)
Peripherally Inserted Central Catheter/Midline Placement  The IV Nurse has discussed with the patient and/or persons authorized to consent for the patient, the purpose of this procedure and the potential benefits and risks involved with this procedure.  The benefits include less needle sticks, lab draws from the catheter and patient may be discharged home with the catheter.  Risks include, but not limited to, infection, bleeding, blood clot (thrombus formation), and puncture of an artery; nerve damage and irregular heat beat.  Alternatives to this procedure were also discussed.  PICC/Midline Placement Documentation        Amber Pham, Amber Pham 12/01/2014, 8:08 AM

## 2014-12-01 NOTE — Progress Notes (Signed)
CARE MANAGEMENT NOTE 12/01/2014  Patient:  Amber Pham,Amber Pham   Account Number:  [id number]  Date Initiated:  12/01/2014  Documentation initiated by:  Serenidy Waltz  Subjective/Objective Assessment:   Sepsis  Right buttocks infection with necrotizing fascitis    Debridement full thickness right buttock wound, 20 x 20  X 8 cm, Dr. Harriette Bouillonhomas Cornett, 11/29/14.  Debridement right buttock wound by myself 11/30/2014  Acute renal insuffiencey  Body     Action/Plan:   tbd may require rehab due to area and surface amount of wounds   Anticipated DC Date:  12/04/2014   Anticipated DC Plan:  HOME W HOME HEALTH SERVICES  In-house referral  NA      DC Planning Services  CM consult      [facility]  [facility]   Choice offered to / List presented to:  NA   DME arranged  NA      DME agency  NA     HH arranged  NA      HH agency  NA   Status of service:  In process, will continue to follow Medicare Important Message given?  NA - LOS <3 / Initial given by admissions (If response is "NO", the following Medicare IM given date fields will be blank) Date Medicare IM given:   Medicare IM given by:   Date Additional Medicare IM given:   Additional Medicare IM given by:    Discharge Disposition:    Per UR Regulation:  Reviewed for med. necessity/level of care/duration of stay  If discussed at Long Length of Stay Meetings, dates discussed:    Comments:  Jan. 26, 16 Amber Noreen L. Earlene Plateravis, RN, BSN, CCM [employer] [place] [phone]

## 2014-12-01 NOTE — Progress Notes (Signed)
Patient ID: Amber ManyMary Pham, female   DOB: 1951-09-29, 64 y.o.   MRN: [id number] 1 Day Post-Op  Subjective: Pt taken back to the OR for debridement yesterday with copious amounts of additional necrotic tissue and dishwater pus.  Pain is tolerable.  She went to the stepdown unit post op due to tachycardia and brief hypoxia which has resolved.  She is undergoing procedure now.    Objective: Vital signs in last 24 hours: Temp:  [97.8 F (36.6 C)-98.7 F (37.1 C)] 98.1 F (36.7 C) (01/26 0800) Pulse Rate:  [91-120] 91 (01/26 0635) Resp:  [18-26] 26 (01/26 0635) BP: (86-121)/(19-66) 116/27 mmHg (01/26 0635) SpO2:  [88 %-100 %] 95 % (01/26 0635) Weight:  [331 lb 9.2 oz (150.4 kg)-336 lb (152.409 kg)] 336 lb (152.409 kg) (01/26 0400) Last BM Date: 11/27/14 Tm 101.1 VSS WBC 25.4Creatinine is 2.09 No records prior to this Admit, except a colonoscopy 05/2007. Intake/Output from previous day: 01/25 0701 - 01/26 0700 In: 6545 [P.O.:520; I.V.:5000; IV Piggyback:650] Out: 4696 [EXBMW:[id number] [Urine:1675; Blood:200] Intake/Output this shift:    General appearance: alert, cooperative and no distress Resp: clear to auscultation bilaterally taking dressing down after she has pain med.  Lab Results:   Recent Labs  11/30/14 2155 12/01/14 0347  WBC 27.9* 27.4*  HGB 8.6* 7.3*  HCT 25.5* 21.5*  PLT 258 246    BMET  Recent Labs  11/30/14 2155 12/01/14 0347  NA 138 135  K 3.7 3.3*  CL 103 105  CO2 24 24  GLUCOSE 151* 122*  BUN 50* 46*  CREATININE 2.04* 1.99*  CALCIUM 8.1* 7.6*   PT/INR No results for input(s): LABPROT, INR in the last 72 hours.   Recent Labs Lab 11/29/14 1013 12/01/14 0347  AST 120* 50*  ALT 27 15  ALKPHOS 109 71  BILITOT 1.8* 0.8  PROT 7.0 5.3*  ALBUMIN 2.4* 1.7*     Lipase  No results found for: LIPASE   Studies/Results: Dg Chest Port 1 View  11/29/2014   CLINICAL DATA:  Buttock abscess with foul drainage for 1 week. Weakness. Initial encounter.  EXAM: PORTABLE  CHEST - 1 VIEW  COMPARISON:  None.  FINDINGS: 1043 hr. There is asymmetric elevation/ eventration of the right hemidiaphragm associate with mild right basilar atelectasis. There is no confluent airspace opacity, edema or significant pleural effusion. The heart size and mediastinal contours are normal. No osseous abnormalities are seen. Telemetry leads overlie the chest and upper abdomen.  IMPRESSION: No acute findings. Right basilar atelectasis adjacent to elevated/ eventration of the right hemidiaphragm   Electronically Signed   By: Roxy HorsemanBill  Veazey M.D.   On: 11/29/2014 11:01    Medications: . [START ON 12/02/2014] enoxaparin (LOVENOX) injection  75 mg Subcutaneous Q24H  . feeding supplement (PRO-STAT SUGAR FREE 64)  30 mL Oral TID WC  . feeding supplement (RESOURCE BREEZE)  1 Container Oral TID BM  . insulin aspart  0-15 Units Subcutaneous 6 times per day  . multivitamin with minerals  1 tablet Oral Daily  . piperacillin-tazobactam (ZOSYN)  IV  3.375 g Intravenous 3 times per day  . sodium hypochlorite   Irrigation BID  . vancomycin  1,750 mg Intravenous Q24H   . dextrose 5 % and 0.9% NaCl 125 mL/hr at 11/30/14 1715   Prior to Admission medications   Medication Sig Start Date End Date Taking? Authorizing Provider  omeprazole (PRILOSEC OTC) 20 MG tablet Take 20 mg by mouth daily.   Yes Historical Provider, MD  Assessment/Plan Sepsis Right buttocks infection with necrotizing fascitis  Debridement full thickness right buttock wound, 20 x 20 X 8 cm, Dr. Harriette Bouillon, 11/29/14. Debridement right buttock wound by myself 11/30/2014 Acute renal insuffiencey Body mass index is 57.65 kg/(m^2). Hypokalemia  Hydrotherapy. Wet to dry with dakins solution for several days.   Transfer to floor picc line for IV access IV antibiotics.       LOS: 2 days    [facility] 12/01/2014

## 2014-12-01 NOTE — Procedures (Signed)
Placement of right arm PICC.  Catheter in basilic vein.  Tip in lower SVC.  Catheter length = 43 cm.  No immediate complication.

## 2014-12-01 NOTE — Progress Notes (Signed)
  HYDROTHERAPY  EVALUATION  12/01/14 1430  Subjective Assessment  Subjective how does it look?  Patient and Family Stated Goals to heal up  Date of Onset 11/29/14 (s/p I/D x2)  Prior Treatments I/D by surgery  Evaluation and Treatment  Evaluation and Treatment Procedures Explained to Patient/Family Yes  Evaluation and Treatment Procedures agreed to  Wound / Incision (Open or Dehisced) 12/01/14 Incision - Open Buttocks Right;Medial large open wound, adipose tissue exposed with deep tunneling  Date First Assessed/Time First Assessed: 12/01/14 1400   Wound Type: Incision - Open  Location: Buttocks  Location Orientation: Right;Medial  Wound Description (Comments): large open wound, adipose tissue exposed with deep tunneling  Present on Admiss...  Dressing Type ABD;Gauze (Comment);Moist to dry;Other (Comment) (Dakins lIghtly soaked kerlix,abd'S hypofix)  Dressing Changed New  Dressing Status Clean;Dry;Intact  Dressing Change Frequency Twice a day (once by PT)  Site / Wound Assessment Dusky;Purple;Red  % Wound base Red or Granulating (difficult to observe base due to significant excess adipose )  % Wound base Black (areas of black  within adipose tissue)  Peri-wound Assessment Edema;Maceration  Wound Length (cm) 10 cm  Wound Width (cm) 14 cm  Wound Depth (cm) 10 cm  Tunneling (cm) 10 (tunneling in multiple ares, between folds of adipose, diffic)  Margins Unattached edges (unapproximated)  Drainage Amount Copious  Drainage Description Purulent  Non-staged Wound Description Not applicable  Treatment Cleansed;Hydrotherapy (Pulse lavage) (Dakins packing x 72 hours.)  Hydrotherapy  Pulsed Lavage with Suction (psi) 4 psi  Pulsed Lavage with Suction - Normal Saline Used 2000 mL  Pulsed Lavage Tip Tip with splash shield  Pulsed lavage therapy - wound location R buttock from rectum to perineum  Wound Therapy - Assess/Plan/Recommendations  Wound Therapy - Clinical Statement Large open wound  that appears to be adipose more external with deep fissures and tunneling  nearer to the perineum. Very difficult to measure and perform  PLS. will try tunnell tip next visit.  Wound Therapy - Functional Problem List obesity, but  pt does get OOB and stand.  Factors Delaying/Impairing Wound Healing Immobility;Infection - systemic/local;Multiple medical problems  Hydrotherapy Plan Debridement;Dressing change;Pulsatile lavage with suction  Wound Therapy - Frequency 6X / week  Wound Therapy - Current Recommendations PT  Wound Therapy - Follow Up Recommendations (TBD)  Wound Plan PLS x 6 x week, dressing change.  Wound Therapy Goals - Improve the function of patient's integumentary system by progressing the wound(s) through the phases of wound healing by:  Decrease Necrotic Tissue to 50  Decrease Necrotic Tissue - Progress Goal set today  Increase Granulation Tissue to 50  Increase Granulation Tissue - Progress Goal set today  Improve Drainage Characteristics Min  Improve Drainage Characteristics - Progress Goal set today  Patient/Family will be able to  reposition off of wound  Patient/Family Instruction Goal - Progress Goal set today  Goals/treatment plan/discharge plan were made with and agreed upon by patient/family Yes  Time For Goal Achievement 2 weeks  Wound Therapy - Potential for Goals Seymour BarsFair  Casson Catena PT [phone]

## 2014-12-01 NOTE — Progress Notes (Signed)
Attempted PICC insertion via left cephalic vein without success.   Unable to thread guidewire beyond shoulder area.  Spoke with Kathlene NovemberMike in Interventional Radiology for referral to them.  Unable to visualize brachial or basilic veins in left arm.   Used left arm due to way she is positioned to keep pressure off of her right buttock.Primary RN and MD aware of referral to IR.

## 2014-12-01 NOTE — Care Management Note (Unsigned)
    Page 1 of 2   12/03/2014     3:51:40 PM CARE MANAGEMENT NOTE 12/03/2014  Patient:  Amber Pham, Amber Pham   Account Number:  [id number]  Date Initiated:  12/01/2014  Documentation initiated by:  DAVIS,RHONDA  Subjective/Objective Assessment:   Sepsis  Right buttocks infection with necrotizing fascitis    Debridement full thickness right buttock wound, 20 x 20  X 8 cm, Dr. Erroll Luna, 11/29/14.  Debridement right buttock wound by myself 11/30/2014  Acute renal insuffiencey  Body     Action/Plan:   tbd may require rehab due to area and surface amount of wounds   Anticipated DC Date:  12/04/2014   Anticipated DC Plan:  LONG TERM ACUTE CARE (LTAC)  In-house referral  NA      DC Planning Services  CM consult      PAC Choice  NA   Choice offered to / List presented to:  NA   DME arranged  NA      DME agency  NA     [place] arranged  NA      [place] agency  NA   Status of service:  In process, will continue to follow Medicare Important Message given?  NA - LOS <3 / Initial given by admissions (If response is "NO", the following Medicare IM given date fields will be blank) Date Medicare IM given:   Medicare IM given by:   Date Additional Medicare IM given:   Additional Medicare IM given by:    Discharge Disposition:    Per UR Regulation:  Reviewed for med. necessity/level of care/duration of stay  If discussed at [place] of Stay Meetings, dates discussed:    Comments:  12/03/14 15:40 Cm met with pt and daughter in room to discuss LTAC.  CM relayed information from [employer] rep, Seth Bake who states: Pt is behind in her premium payments for insurance and would need to pay the three months prior to insurance covering; pt would then have an $11,500.00 deductible; and pt would then have 50% coverage. Pt states her daughter, Maudie Mercury, takes care of the insurance and please call her to verify the above deficit in premiums.  CM called Kim at both her personal number and her work number but  there was no answer at either and her personal mailbox is full.  Regardless, of whether or not premiums are up-to-date, pt states at this time she is not interested in LTAC with this lak of coverage.  CM notified MD.  Will continue to monitor for progress and disposition. Mariane Masters, BSN, [place].  12/03/14 12:00 CM received request from MD (on unit) to please look into LTAC.  Referral given to [employer] rep, Seth Bake and  [employer], Konrad Dolores. CM waiting for callbacks from both.  Mariane Masters, BSN, CM [phone].   Jan. 26, [place] Rosana Hoes, RN, BSN, CCM Case [employer] [phone]

## 2014-12-02 LAB — CBC
HCT: 22.2 % — ABNORMAL LOW (ref 36.0–46.0)
Hemoglobin: 7.2 g/dL — ABNORMAL LOW (ref 12.0–15.0)
MCH: 30.3 pg (ref 26.0–34.0)
MCHC: 32.4 g/dL (ref 30.0–36.0)
MCV: 93.3 fL (ref 78.0–100.0)
Platelets: 243 10*3/uL (ref 150–400)
RBC: 2.38 MIL/uL — AB (ref 3.87–5.11)
RDW: 15.5 % (ref 11.5–15.5)
WBC: 22 10*3/uL — ABNORMAL HIGH (ref 4.0–10.5)

## 2014-12-02 LAB — BASIC METABOLIC PANEL
ANION GAP: 5 (ref 5–15)
BUN: 36 mg/dL — AB (ref 6–23)
CO2: 24 mmol/L (ref 19–32)
Calcium: 7.8 mg/dL — ABNORMAL LOW (ref 8.4–10.5)
Chloride: 106 mmol/L (ref 96–112)
Creatinine, Ser: 1.84 mg/dL — ABNORMAL HIGH (ref 0.50–1.10)
GFR calc Af Amer: 33 mL/min — ABNORMAL LOW (ref >90)
GFR calc non Af Amer: 28 mL/min — ABNORMAL LOW (ref >90)
GLUCOSE: 170 mg/dL — AB (ref 70–99)
Potassium: 3.5 mmol/L (ref 3.5–5.1)
Sodium: 135 mmol/L (ref 135–145)

## 2014-12-02 LAB — CULTURE, ROUTINE-ABSCESS

## 2014-12-02 LAB — GLUCOSE, CAPILLARY
GLUCOSE-CAPILLARY: 171 mg/dL — AB (ref 70–99)
GLUCOSE-CAPILLARY: 130 mg/dL — AB (ref 70–99)
GLUCOSE-CAPILLARY: 135 mg/dL — AB (ref 70–99)
Glucose-Capillary: 103 mg/dL — ABNORMAL HIGH (ref 70–99)
Glucose-Capillary: 117 mg/dL — ABNORMAL HIGH (ref 70–99)
Glucose-Capillary: 120 mg/dL — ABNORMAL HIGH (ref 70–99)
Glucose-Capillary: 121 mg/dL — ABNORMAL HIGH (ref 70–99)

## 2014-12-02 MED ORDER — FAMOTIDINE 20 MG PO TABS: 20.0000 mg | ORAL_TABLET | Freq: Two times a day (BID) | ORAL | Status: DC

## 2014-12-02 MED ORDER — FAMOTIDINE 20 MG PO TABS
20.0000 mg | ORAL_TABLET | Freq: Two times a day (BID) | ORAL | Status: DC
  Administered 2014-12-02 – 2014-12-07 (×10): 20 mg via ORAL
  Filled 2014-12-02 (×11): qty 1

## 2014-12-02 NOTE — Progress Notes (Signed)
ANTIBIOTIC CONSULT NOTE - Follow Up  Pharmacy Consult for Vancomcyin, Zosyn Indication: Necrotizing right buttock abscess/infection, Code Sepsis  No Known Allergies  Patient Measurements: Height: 5\' 4"  (162.6 cm) Weight: (!) 336 lb (152.409 kg) IBW/kg (Calculated) : 54.7   Vital Signs: Temp: 99.1 F (37.3 C) (01/27 0400) Temp Source: Oral (01/27 0400) BP: 117/41 mmHg (01/27 0137) Pulse Rate: 82 (01/27 0500) Intake/Output from previous day: 01/26 0701 - 01/27 0700 In: 2265 [P.O.:240; I.V.:1375; IV Piggyback:650] Out: 1175 [Urine:1175] Intake/Output from this shift:    Labs:  Recent Labs  11/30/14 0444 11/30/14 1208 11/30/14 2155 12/01/14 0347  WBC 25.4* 26.2* 27.9* 27.4*  HGB 9.7* 9.0* 8.6* 7.3*  PLT 249 218 258 246  CREATININE 2.09*  --  2.04* 1.99*   Estimated Creatinine Clearance: 42.8 mL/min (by C-G formula based on Cr of 1.99). No results for input(s): VANCOTROUGH, VANCOPEAK, VANCORANDOM, GENTTROUGH, GENTPEAK, GENTRANDOM, TOBRATROUGH, TOBRAPEAK, TOBRARND, AMIKACINPEAK, AMIKACINTROU, AMIKACIN in the last 72 hours.   Microbiology: Recent Results (from the past 720 hour(s))  Blood Culture (routine x 2)     Status: None (Preliminary result)   Collection Time: 11/29/14 10:13 AM  Result Value Ref Range Status   Specimen Description BLOOD RIGHT ANTECUBITAL  Final   Special Requests BOTTLES DRAWN AEROBIC AND ANAEROBIC 5CC EACH  Final   Culture   Final           BLOOD CULTURE RECEIVED NO GROWTH TO DATE CULTURE WILL BE HELD FOR 5 DAYS BEFORE ISSUING A FINAL NEGATIVE REPORT [facility]formed [facility]    Report Status PENDING  Incomplete  Blood Culture (routine x 2)     Status: None (Preliminary result)   Collection Time: 11/29/14 10:13 AM  Result Value Ref Range Status   Specimen Description BLOOD LEFT ANTECUBITAL  Final   Special Requests BOTTLES DRAWN AEROBIC AND ANAEROBIC 5CC EACH  Final   Culture   Final           BLOOD CULTURE RECEIVED NO GROWTH TO  DATE CULTURE WILL BE HELD FOR 5 DAYS BEFORE ISSUING A FINAL NEGATIVE REPORT Performed at [facility]    Report Status PENDING  Incomplete  Anaerobic culture     Status: None (Preliminary result)   Collection Time: 11/29/14  1:20 PM  Result Value Ref Range Status   Specimen Description ABSCESS  Final   Special Requests NONE  Final   Gram Stain   Final    FEW WBC PRESENT, PREDOMINANTLY PMN NO SQUAMOUS EPITHELIAL CELLS SEEN ABUNDANT GRAM NEGATIVE RODS ABUNDANT GRAM POSITIVE RODS ABUNDANT GRAM POSITIVE COCCI IN PAIRS    Culture   Final    NO ANAEROBES ISOLATED; CULTURE IN PROGRESS FOR 5 DAYS Performed at [facility]    Report Status PENDING  Incomplete  Culture, routine-abscess     Status: None (Preliminary result)   Collection Time: 11/29/14  1:20 PM  Result Value Ref Range Status   Specimen Description ABSCESS RIGHT BUTTOCKS  Final   Special Requests NONE  Final   Gram Stain   Final    FEW WBC PRESENT, PREDOMINANTLY PMN NO SQUAMOUS EPITHELIAL CELLS SEEN ABUNDANT GRAM POSITIVE RODS ABUNDANT GRAM NEGATIVE RODS ABUNDANT GRAM POSITIVE COCCI IN PAIRS    Culture   Final    Culture reincubated for better growth Performed at [facility]    Report Status PENDING  Incomplete  Urine culture     Status: None   Collection Time: 11/29/14  7:09 PM  Result Value Ref Range Status   Specimen Description URINE, CATHETERIZED  Final   Special Requests NONE  Final   Colony Count NO GROWTH Performed at [facility]   Final   Culture NO GROWTH Performed at [facility]   Final   Report Status 11/30/2014 FINAL  Final  Surgical pcr screen     Status: None   Collection Time: 11/30/14  9:12 AM  Result Value Ref Range Status   MRSA, PCR NEGATIVE NEGATIVE Final   Staphylococcus aureus NEGATIVE NEGATIVE Final    Comment:        The Xpert SA Assay (FDA approved for NASAL specimens in patients over 64 years of age), is one component of a  comprehensive surveillance program.  Test performance has been validated by [facility] for patients greater than or equal to 64 year old. It is not intended to diagnose infection nor to guide or monitor treatment.    Assessment: 64 y/oF who presents 1/24 to [facility] ED for evaluation of sore of the right buttocks, progressively getting larger, having a malodorous drainage, and with some areas of black discoloration.  Code sepsis initiated. MD indicates this is a necrotizing abscess/infection, and patient emergently taken to OR for surgical debridement.  Pharmacy consulted to assist with dosing of Vancomycin and Zosyn.   1/24 >> Vancomycin >> 1/24 >> Zosyn >>    Tmax: afeb x 48h WBCs: elevated/unchanged Renal: SCr 1.99, CrCl ~ 33 mL/min (good UOP) PCT: 14.76 (1/24) Lactic acid: 3.24 > 1.9  1/24 blood x 2: NGTD 1/24 urine: NGF 1/24 abscess: (GS = abundant GPR, GNR, GPC pairs) - reincubating  Goal of Therapy:  Vancomycin trough level 15-20 mcg/ml  Appropriate antibiotic dosing for renal function and indication Eradication of infection  Plan:   Continue vancomycin  IV q24h - plan to check trough tomorrow 1/28 prior to 4th maintenance dose  Continue Zosyn 3.375gm IV q8h (4hr extended infusions) Follow up renal function & cultures, clinical course  Loralee Pacas, PharmD, BCPS Pager: [phone] 11/29/2014 3:49 PM

## 2014-12-02 NOTE — Progress Notes (Signed)
  Hydrotherapy treatment note 12/02/14 1539  Subjective Assessment  Subjective It hurt more today  Evaluation and Treatment  Evaluation and Treatment Procedures Explained to Patient/Family Yes  Evaluation and Treatment Procedures agreed to  Wound / Incision (Open or Dehisced) 12/01/14 Incision - Open Buttocks Right;Medial large open wound, adipose tissue exposed with deep tunneling  Date First Assessed/Time First Assessed: 12/01/14 1400   Wound Type: Incision - Open  Location: Buttocks  Location Orientation: Right;Medial  Wound Description (Comments): large open wound, adipose tissue exposed with deep tunneling  Present on Admiss...  Dressing Type ABD;Gauze (Comment) (Dakins soaked kerlix, 4x4's, ABDs, hypofix)  Dressing Changed Changed  Dressing Status Clean;Dry;Intact  Dressing Change Frequency Twice a day  Site / Wound Assessment Brown;Dusky;Pale;Pink;Yellow (adipose tissue  majority of wound, deep fissurs and tunnels )  % Wound base Red or Granulating (difficulty to inspect deep wounds)  % Wound base Other (Comment) 10% (adipose spersed with pink asand brown/dried looking tissue)  Peri-wound Assessment Edema  Wound Length (cm) 10 cm  Wound Width (cm) 14 cm  Wound Depth (cm) 10 cm  Tunneling (cm) 10 at 6:00, 5 cm suerior to 6:00, 5 cm 11:00 (really difficult to measure, multiple crevices within wound )  Margins Unattached edges (unapproximated)  Drainage Amount Moderate  Drainage Description Purulent;Odor  Non-staged Wound Description Not applicable  Hydrotherapy  Pulsed Lavage with Suction (psi) 8 psi  Pulsed Lavage with Suction - Normal Saline Used 2000 mL  Pulsed Lavage Tip Tip with splash shield  Pulsed lavage therapy - wound location R buttock from rectum to perineum  Wound Therapy - Assess/Plan/Recommendations  Wound Therapy - Clinical Statement Large open wound that appears to be adipose more external with deep fissures and tunneling  nearer to the perineum. Very difficult  to measure and perform  PLS. will try tunnell tip next visit.  Wound Therapy - Functional Problem List obesity, but  pt does get OOB and stand.  Factors Delaying/Impairing Wound Healing Immobility;Infection - systemic/local;Multiple medical problems  Hydrotherapy Plan Debridement;Patient/family education;Pulsatile lavage with suction  Wound Therapy - Frequency 6X / week  Wound Therapy - Current Recommendations PT  Wound Plan PLS x 6 x week, dressing change.  Wound Therapy Goals - Improve the function of patient's integumentary system by progressing the wound(s) through the phases of wound healing by:  Decrease Necrotic Tissue to 50  Decrease Necrotic Tissue - Progress Progressing toward goal  Increase Granulation Tissue to 50  Increase Granulation Tissue - Progress Progressing toward goal  Improve Drainage Characteristics Min  Improve Drainage Characteristics - Progress Progressing toward goal  Patient/Family will be able to  reposition off of wound  Goals/treatment plan/discharge plan were made with and agreed upon by patient/family Yes  Time For Goal Achievement 2 weeks  Wound Therapy - Potential for Goals [place]  Ethelean Colla PT [phone]

## 2014-12-02 NOTE — Progress Notes (Signed)
2 Days Post-Op  Subjective: She looks much better, and is feeling better.  They just changed her dressing not to long ago.  I will look at the wound when hydrotherapy comes to see her.  Objective: Vital signs in last 24 hours: Temp:  [98.1 F (36.7 C)-99.1 F (37.3 C)] 99.1 F (37.3 C) (01/27 0400) Pulse Rate:  [80-88] 82 (01/27 0500) Resp:  [11-22] 12 (01/27 0500) BP: (117-125)/(25-45) 117/41 mmHg (01/27 0137) SpO2:  [99 %-100 %] 100 % (01/27 0500) Last BM Date: 11/29/14 240 PO  C[place] diet 1175 urine output TM 99.1, VSS tachycardia better. No labs currently Blood cultures - Negative so far Specimen Description ABSCESS RIGHT BUTTOCKS   Special Requests NONE   Gram Stain FEW WBC PRESENT, PREDOMINANTLY PMN  NO SQUAMOUS EPITHELIAL CELLS SEEN  ABUNDANT GRAM POSITIVE RODS  ABUNDANT GRAM NEGATIVE RODS  ABUNDANT GRAM POSITIVE COCCI IN PAIRS     Urine culture:  No growth so far.  Intake/Output from previous day: 01/26 0701 - 01/27 0700 In: 2265 [P.O.:240; I.V.:1375; IV Piggyback:650] Out: 1175 [Urine:1175] Intake/Output this shift:    General appearance: alert, cooperative and no distress Resp: clear to auscultation bilaterally Skin: I will look at the wound when we do hydro therapy.  Lab Results:   Recent Labs  11/30/14 2155 12/01/14 0347  WBC 27.9* 27.4*  HGB 8.6* 7.3*  HCT 25.5* 21.5*  PLT 258 246    BMET  Recent Labs  11/30/14 2155 12/01/14 0347  NA 138 135  K 3.7 3.3*  CL 103 105  CO2 24 24  GLUCOSE 151* 122*  BUN 50* 46*  CREATININE 2.04* 1.99*  CALCIUM 8.1* 7.6*   PT/INR No results for input(s): LABPROT, INR in the last 72 hours.   Recent Labs Lab 11/29/14 1013 12/01/14 0347  AST 120* 50*  ALT 27 15  ALKPHOS 109 71  BILITOT 1.8* 0.8  PROT 7.0 5.3*  ALBUMIN 2.4* 1.7*     Lipase  No results found for: LIPASE   Studies/Results: Ir Fluoro Guide Cv Line Right  12/01/2014   CLINICAL DATA:  Necrotic right buttock wound.  EXAM:  PERIPHERALLY INSERTED CENTRAL VENOUS CATHETER WITH ULTRASOUND AND FLUOROSCOPIC GUIDANCE  FLUOROSCOPY TIME:  30 seconds, 9 mGy.  TECHNIQUE: The procedure was explained to the patient. The risks and benefits of the procedure were discussed and the patient's questions were addressed. Informed consent was obtained from the patient. The right arm was prepped with chlorhexidine, draped in the usual sterile fashion using maximum barrier technique (cap and mask, sterile gown, sterile gloves, large sterile sheet, hand hygiene and cutaneous antiseptic). Local anesthesia was attained by infiltration with 1% lidocaine.  Ultrasound demonstrated patency of the basilic vein, and this was documented with an image. Under real-time ultrasound guidance, this vein was accessed with a 21 gauge micropuncture needle and image documentation was performed. The needle was exchanged over a guidewire for a peel-away sheath through which a 43 cm 5 [place] dual lumen power injectable PICC was advanced, and positioned with its tip in the lower SVC. Fluoroscopy during the procedure and fluoro spot radiograph confirms appropriate catheter position. The catheter was flushed, secured to the skin with Prolene sutures, and covered with a sterile dressing.  COMPLICATIONS: None.  The patient tolerated the procedure well.  IMPRESSION: Successful placement of a right arm PICC with sonographic and fluoroscopic guidance. The catheter is ready for use.   Electronically Signed   By: Richarda OverlieAdam  Henn M.D.   On: 12/01/2014  10:24   Ir US Guide Vasc Access Right  12/01/2014   CLINICAL DATA:  Necrotic right buttock wound.  EXAM: PERIPHERALLY INSERTED CENTRAL VENOUS CATHETER WITH ULTRASOUND AND FLUOROSCOPIC GUIDANCE  FLUOROSCOPY TIME:  30 seconds, 9 mGy.  TECHNIQUE: The procedure was explained to the patient. The risks and benefits of the procedure were discussed and the patient's questions were addressed. Informed consent was obtained from the patient. The right arm  was prepped with chlorhexidine, draped in the usual sterile fashion using maximum barrier technique (cap and mask, sterile gown, sterile gloves, large sterile sheet, hand hygiene and cutaneous antiseptic). Local anesthesia was attained by infiltration with 1% lidocaine.  Ultrasound demonstrated patency of the basilic vein, and this was documented with an image. Under real-time ultrasound guidance, this vein was accessed with a 21 gauge micropuncture needle and image documentation was performed. The needle was exchanged over a guidewire for a peel-away sheath through which a 43 cm 5 [place] dual lumen power injectable PICC was advanced, and positioned with its tip in the lower SVC. Fluoroscopy during the procedure and fluoro spot radiograph confirms appropriate catheter position. The catheter was flushed, secured to the skin with Prolene sutures, and covered with a sterile dressing.  COMPLICATIONS: None.  The patient tolerated the procedure well.  IMPRESSION: Successful placement of a right arm PICC with sonographic and fluoroscopic guidance. The catheter is ready for use.   Electronically Signed   By: Richarda Overlie M.D.   On: 12/01/2014 10:24    Medications: . enoxaparin (LOVENOX) injection  75 mg Subcutaneous Q24H  . feeding supplement (PRO-STAT SUGAR FREE 64)  30 mL Oral TID WC  . feeding supplement (RESOURCE BREEZE)  1 Container Oral TID BM  . insulin aspart  0-15 Units Subcutaneous 6 times per day  . multivitamin with minerals  1 tablet Oral Daily  . piperacillin-tazobactam (ZOSYN)  IV  3.375 g Intravenous 3 times per day  . sodium hypochlorite   Irrigation BID  . vancomycin  1,750 mg Intravenous Q24H    Assessment/Plan 1.  Sepsis 2.  Right buttocks infection with necrotizing fascitis  3.  Debridement full thickness right buttock wound, 20 x 20 X 8 cm, Dr. Harriette Bouillon, 11/29/14. 4.  Debridement right buttock wound, Dr. Almond Lint 11/30/2014 5.  Acute renal insuffiencey 6.  Body mass index is  57.65 kg/(m^2). 7.  Hypokalemia   Plan:  I will look at the wound later today with Hydrohtherapy.  I am going to check labs also.  We may be able to transfer back to floor later today if everything looks good.       LOS: 3 days    Amber Pham 12/02/2014

## 2014-12-02 NOTE — Progress Notes (Signed)
NUTRITION FOLLOW UP  Intervention:   - Resource Breeze po BID, each supplement provides 250 kcal and 9 grams of protein - Prostat TID, each supplement provides 100 kcal and 15 g protein.  - Multivitamin with minerals - RD will Continue to monitor  Nutrition Dx:   Increased nutrient needs related to protein as evidenced by wound on buttocks; ongoing  Goal:   Pt to meet >/= 90% of their estimated nutrition needs; met  Monitor:   Weight trend, labs, wound healing, po intake, acceptance of supplements  Assessment:   Pt complains 1 week history of right buttock pain drainage and foul smelling odor. Has neglected it and no care sought out until today. Complains of right buttock pain, Drainage and foul smell.  1/25: - Pt s/p I&D 1/25. - Pt reports poor appetite prior to admission. She was only eating boiled eggs and fruit cups.  - Pt's current diet is heart healthy. She says that she is starting feel hungry for things like popsickles.  - Will order prostat and MVI to promote wound healing.   1/27: - Spoke with pt's daughter who reported that pt has not been eating well until this morning. - Pt has been doing well with nutritional supplements. - Discussed importance of small, frequent meals with high-protein foods at each meal to promote wound healing.   Labs Reviewed  Height: Ht Readings from Last 1 Encounters:  11/30/14 _0  (1.626 m)    Weight Status:   Wt Readings from Last 1 Encounters:  12/01/14 336 lb (152.409 kg)    Re-estimated needs:  Kcal: 1900-2100 Protein: 120-130 g Fluid: 1.9-2.1 L/day  Skin: closed incision on right buttocks  Diet Order: Diet Heart   Intake/Output Summary (Last 24 hours) at 12/02/14 1327 Last data filed at 12/02/14 1300  Gross per 24 hour  Intake   3120 ml  Output   1750 ml  Net   1370 ml    Last BM: prior to admission   Labs:   Recent Labs Lab 11/30/14 2155 12/01/14 0347 12/02/14 0903  NA 138 135 135  K 3.7 3.3*  3.5  CL 103 105 106  CO2 _1 BUN 50* 46* 36*  CREATININE 2.04* 1.99* 1.84*  CALCIUM 8.1* 7.6* 7.8*  GLUCOSE 151* 122* 170*    CBG (last 3)   Recent Labs  12/02/14 0438 12/02/14 0848 12/02/14 1230  GLUCAP 121* 171* 130*    Scheduled Meds: . enoxaparin (LOVENOX) injection  75 mg Subcutaneous Q24H  . feeding supplement (PRO-STAT SUGAR FREE 64)  30 mL Oral TID WC  . feeding supplement (RESOURCE BREEZE)  1 Container Oral TID BM  . insulin aspart  0-15 Units Subcutaneous 6 times per day  . multivitamin with minerals  1 tablet Oral Daily  . piperacillin-tazobactam (ZOSYN)  IV  3.375 g Intravenous 3 times per day  . sodium hypochlorite   Irrigation BID  . vancomycin  1,750 mg Intravenous Q24H    Continuous Infusions: . dextrose 5 % and 0.9% NaCl 125 mL/hr at 12/02/14 0800    Laurette Schimke MS, RD, LDN

## 2014-12-03 LAB — BASIC METABOLIC PANEL
ANION GAP: 5 (ref 5–15)
BUN: 30 mg/dL — AB (ref 6–23)
CHLORIDE: 109 mmol/L (ref 96–112)
CO2: 25 mmol/L (ref 19–32)
CREATININE: 1.67 mg/dL — AB (ref 0.50–1.10)
Calcium: 8 mg/dL — ABNORMAL LOW (ref 8.4–10.5)
GFR calc Af Amer: 37 mL/min — ABNORMAL LOW (ref >90)
GFR calc non Af Amer: 32 mL/min — ABNORMAL LOW (ref >90)
GLUCOSE: 115 mg/dL — AB (ref 70–99)
POTASSIUM: 3.5 mmol/L (ref 3.5–5.1)
SODIUM: 139 mmol/L (ref 135–145)

## 2014-12-03 LAB — CBC
HCT: 20.8 % — ABNORMAL LOW (ref 36.0–46.0)
Hemoglobin: 6.6 g/dL — CL (ref 12.0–15.0)
MCH: 29.7 pg (ref 26.0–34.0)
MCHC: 31.7 g/dL (ref 30.0–36.0)
MCV: 93.7 fL (ref 78.0–100.0)
Platelets: 244 10*3/uL (ref 150–400)
RBC: 2.22 MIL/uL — ABNORMAL LOW (ref 3.87–5.11)
RDW: 15.7 % — AB (ref 11.5–15.5)
WBC: 15.6 10*3/uL — ABNORMAL HIGH (ref 4.0–10.5)

## 2014-12-03 LAB — GLUCOSE, CAPILLARY
GLUCOSE-CAPILLARY: 156 mg/dL — AB (ref 70–99)
Glucose-Capillary: 112 mg/dL — ABNORMAL HIGH (ref 70–99)
Glucose-Capillary: 119 mg/dL — ABNORMAL HIGH (ref 70–99)
Glucose-Capillary: 125 mg/dL — ABNORMAL HIGH (ref 70–99)
Glucose-Capillary: 161 mg/dL — ABNORMAL HIGH (ref 70–99)
Glucose-Capillary: 97 mg/dL (ref 70–99)

## 2014-12-03 LAB — VANCOMYCIN, TROUGH: Vancomycin Tr: 23.6 ug/mL — ABNORMAL HIGH (ref 10.0–20.0)

## 2014-12-03 LAB — ABO/RH: ABO/RH(D): A POS

## 2014-12-03 LAB — PREPARE RBC (CROSSMATCH)

## 2014-12-03 MED ORDER — VANCOMYCIN HCL IN DEXTROSE 750-5 MG/150ML-% IV SOLN
750.0000 mg | Freq: Two times a day (BID) | INTRAVENOUS | Status: DC
  Administered 2014-12-03 – 2014-12-06 (×6): 750 mg via INTRAVENOUS
  Filled 2014-12-03 (×6): qty 150

## 2014-12-03 MED ORDER — SODIUM CHLORIDE 0.9 % IV SOLN
Freq: Once | INTRAVENOUS | Status: AC
  Administered 2014-12-03: 11:00:00 via INTRAVENOUS

## 2014-12-03 MED ORDER — HYDROMORPHONE HCL 1 MG/ML IJ SOLN
1.0000 mg | Freq: Two times a day (BID) | INTRAMUSCULAR | Status: DC | PRN
  Filled 2014-12-03: qty 1

## 2014-12-03 NOTE — Progress Notes (Signed)
12/03/14 0615 Nursing Dr. Carolynne Edouardtoth paged regarding patient's hgb 6.6 this am. No answer to page at this time

## 2014-12-03 NOTE — Progress Notes (Signed)
Hydrotherapy PT NOTE    12/03/14 1500  Subjective Assessment  Subjective "Thank you for taking care of me."  Evaluation and Treatment  Evaluation and Treatment Procedures Explained to Patient/Family Yes  Evaluation and Treatment Procedures agreed to  Wound / Incision (Open or Dehisced) 12/01/14 Incision - Open Buttocks Right;Medial large open wound, adipose tissue exposed with deep tunneling  Date First Assessed/Time First Assessed: 12/01/14 1400   Wound Type: Incision - Open  Location: Buttocks  Location Orientation: Right;Medial  Wound Description (Comments): large open wound, adipose tissue exposed with deep tunneling  Present on Admiss...  Dressing Type ABD;Gauze (Comment) (Dakins soaked kerlix, 4x4's, ABDs, hypofix)  Dressing Changed Changed  Dressing Status Clean;Dry;Intact  Dressing Change Frequency Twice a day  Site / Wound Assessment Brown;Dusky;Pale;Pink;Yellow;Bleeding (adipose tissue majority of wound, deep fissures and tunnels )  % Wound base Red or Granulating (difficulty to inspect deep wounds)  % Wound base Other (Comment) 10% (adipose spersed with pink and brown/dried looking tissue)  Peri-wound Assessment Edema  Margins Unattached edges (unapproximated)  Drainage Amount Moderate  Drainage Description Purulent;Odor  Non-staged Wound Description Not applicable  Treatment Debridement (Selective);Hydrotherapy (Pulse lavage);Other (Comment) (Dakins packing x72 hrs)  Hydrotherapy  Pulsed Lavage with Suction (psi) 8 psi  Pulsed Lavage with Suction - Normal Saline Used 2000 mL  Pulsed Lavage Tip Tip with splash shield  Pulsed lavage therapy - wound location R buttock from rectum to perineum  Selective Debridement  Selective Debridement - Location R buttock wound  Selective Debridement - Tools Used Forceps  Selective Debridement - Tissue Removed very small amount of slough, dead tissue that came away easily after pulsatile lavage  Wound Therapy -  Assess/Plan/Recommendations  Wound Therapy - Clinical Statement Large open wound that appears to be adipose more external with deep fissures and tunneling  nearer to the perineum. Very difficult to measure and perform  PLS.  Started with tunnel tip and then switched to regular tip.  Dressing and bed with stool upon arrival today, appears to be from rectum; no stool observed in tunnels/fissures from visual inspection or on tunnel tip however difficult to be sure due to wound structure  Wound Therapy - Functional Problem List obesity, but  pt does get OOB and stand.  Factors Delaying/Impairing Wound Healing Immobility;Infection - systemic/local;Multiple medical problems  Hydrotherapy Plan Debridement;Patient/family education;Pulsatile lavage with suction  Wound Therapy - Frequency 6X / week  Wound Therapy - Current Recommendations PT  Wound Plan PLS x 6 x week, dressing change.  Wound Therapy Goals - Improve the function of patient's integumentary system by progressing the wound(s) through the phases of wound healing by:  Decrease Necrotic Tissue to 50  Decrease Necrotic Tissue - Progress Progressing toward goal  Increase Granulation Tissue to 50  Increase Granulation Tissue - Progress Progressing toward goal  Improve Drainage Characteristics Min  Improve Drainage Characteristics - Progress Progressing toward goal  Patient/Family will be able to  reposition off of wound  Goals/treatment plan/discharge plan were made with and agreed upon by patient/family Yes  Time For Goal Achievement 2 weeks  Wound Therapy - Potential for Goals Fair   Zenovia JarredKati Deb Loudin, PT, DPT 12/03/2014 Pager: [phone]  Time: 7253-6644: 1202-1242

## 2014-12-03 NOTE — Progress Notes (Signed)
ANTIBIOTIC CONSULT NOTE - Follow Up  Pharmacy Consult for Vancomcyin, Zosyn Indication: Necrotizing right buttock abscess/infection, Code Sepsis  No Known Allergies  Patient Measurements: Height: 5\' 4"  (162.6 cm) Weight: (!) 336 lb (152.409 kg) IBW/kg (Calculated) : 54.7   Vital Signs: Temp: 97.7 F (36.5 C) (01/28 0957) Temp Source: Oral (01/28 0957) BP: 139/54 mmHg (01/28 0957) Pulse Rate: 85 (01/28 0957) Intake/Output from previous day: 01/27 0701 - 01/28 0700 In: 2255 [P.O.:480; I.V.:1125; IV Piggyback:650] Out: 1425 [Urine:1425] Intake/Output from this shift: Total I/O In: 360 [P.O.:360] Out: 400 [Urine:400]  Labs:  Recent Labs  12/01/14 0347 12/02/14 0903 12/03/14 0500  WBC 27.4* 22.0* 15.6*  HGB 7.3* 7.2* 6.6*  PLT 246 243 244  CREATININE 1.99* 1.84* 1.67*   Estimated Creatinine Clearance: 51.1 mL/min (by C-G formula based on Cr of 1.67). No results for input(s): VANCOTROUGH, VANCOPEAK, VANCORANDOM, GENTTROUGH, GENTPEAK, GENTRANDOM, TOBRATROUGH, TOBRAPEAK, TOBRARND, AMIKACINPEAK, AMIKACINTROU, AMIKACIN in the last 72 hours.   Microbiology: 1/24 blood x 2: NGTD 1/24 urine: NGF 1/24 abscess: multiple org, none predom, no S.aureus or Grp A Strep.  Antimicrobials: 1/24 >> Vancomycin >> 1/24 >> Zosyn >>     Assessment: 89 y/oF who [facility] for evaluation of sore of the right buttocks, progressively getting larger, having a malodorous drainage, and with some areas of black discoloration.  Code sepsis initiated. MD indicated this is a necrotizing abscess/infection, and patient emergently taken to OR for surgical debridement.  Pharmacy consulted to assist with dosing of Vancomycin and Zosyn.   Today, 12/03/2014 D#5 Vancomycin 1750 mg IV q24h after 2500 mg loading dose D#5 Zosyn 3.375 grams IV q8h (extended-infusion, each dose over 4 hours) Afebrile WBC elevated but improving SCr slowly improving; baseline unknown   Goal of Therapy:   Vancomycin trough level 15-20 mcg/ml  Appropriate antibiotic dosing for renal function and indication Eradication of infection  Plan:  1. Check vancomycin trough today at 3 pm (prior to 4th maintenance dose) 2. Continue present Zosyn dosage.  Elie Goodyandy Bristyn Kulesza, PharmD, BCPS Pager: [phone] 12/03/2014  11:03 AM

## 2014-12-03 NOTE — Progress Notes (Signed)
PT Cancellation Note  Patient Details Name: Amber Pham MRN: [id number] DOB: 03-29-1951   Cancelled Treatment:    Reason Eval/Treat Not Completed: Medical issues which prohibited therapy (low HGB, to get PRBCs today)  Will attempt to mobilize upon hydrotherapy visit tomorrow.   Auria Mckinlay,KATHrine E 12/03/2014, 3:58 PM Zenovia JarredKati Savon Bordonaro, PT, DPT 12/03/2014 Pager: [phone]

## 2014-12-03 NOTE — Progress Notes (Signed)
[facility] Progress Note  3 Days Post-Op  Subjective: Patient doing well.  Dressing changes okay but still requiring IV pain meds.  Daughter Selena Batten at bedside.  Tolerating diet.  Would like to get up to the chair and work with therapies.  Objective: Vital signs in last 24 hours: Temp:  [98 F (36.7 C)-98.5 F (36.9 C)] 98 F (36.7 C) (01/28 0409) Pulse Rate:  [85-95] 85 (01/28 0409) Resp:  [15-19] 15 (01/28 0409) BP: (103-140)/(40-72) 123/51 mmHg (01/28 0409) SpO2:  [97 %-100 %] 98 % (01/28 0409) Last BM Date: 11/29/14  Intake/Output from previous day: 01/27 0701 - 01/28 0700 In: 2255 [P.O.:480; I.V.:1125; IV Piggyback:650] Out: 1425 [Urine:1425] Intake/Output this shift:    PE: Gen:  Alert, NAD, pleasant Will look at wound later with hydrotherapy  Lab Results:   Recent Labs  12/02/14 0903 12/03/14 0500  WBC 22.0* 15.6*  HGB 7.2* 6.6*  HCT 22.2* 20.8*  PLT 243 244   BMET  Recent Labs  12/02/14 0903 12/03/14 0500  NA 135 139  K 3.5 3.5  CL 106 109  CO2 24 25  GLUCOSE 170* 115*  BUN 36* 30*  CREATININE 1.84* 1.67*  CALCIUM 7.8* 8.0*   PT/INR No results for input(s): LABPROT, INR in the last 72 hours. CMP     Component Value Date/Time   NA 139 12/03/2014 0500   K 3.5 12/03/2014 0500   CL 109 12/03/2014 0500   CO2 25 12/03/2014 0500   GLUCOSE 115* 12/03/2014 0500   BUN 30* 12/03/2014 0500   CREATININE 1.67* 12/03/2014 0500   CALCIUM 8.0* 12/03/2014 0500   PROT 5.3* 12/01/2014 0347   ALBUMIN 1.7* 12/01/2014 0347   AST 50* 12/01/2014 0347   ALT 15 12/01/2014 0347   ALKPHOS 71 12/01/2014 0347   BILITOT 0.8 12/01/2014 0347   GFRNONAA 32* 12/03/2014 0500   GFRAA 37* 12/03/2014 0500   Lipase  No results found for: LIPASE     Studies/Results: Ir Fluoro Guide Cv Line Right  12/01/2014   CLINICAL DATA:  Necrotic right buttock wound.  EXAM: PERIPHERALLY INSERTED CENTRAL VENOUS CATHETER WITH ULTRASOUND AND FLUOROSCOPIC GUIDANCE   FLUOROSCOPY TIME:  30 seconds, 9 mGy.  TECHNIQUE: The procedure was explained to the patient. The risks and benefits of the procedure were discussed and the patient's questions were addressed. Informed consent was obtained from the patient. The right arm was prepped with chlorhexidine, draped in the usual sterile fashion using maximum barrier technique (cap and mask, sterile gown, sterile gloves, large sterile sheet, hand hygiene and cutaneous antiseptic). Local anesthesia was attained by infiltration with 1% lidocaine.  Ultrasound demonstrated patency of the basilic vein, and this was documented with an image. Under real-time ultrasound guidance, this vein was accessed with a 21 gauge micropuncture needle and image documentation was performed. The needle was exchanged over a guidewire for a peel-away sheath through which a 43 cm 5 [place] dual lumen power injectable PICC was advanced, and positioned with its tip in the lower SVC. Fluoroscopy during the procedure and fluoro spot radiograph confirms appropriate catheter position. The catheter was flushed, secured to the skin with Prolene sutures, and covered with a sterile dressing.  COMPLICATIONS: None.  The patient tolerated the procedure well.  IMPRESSION: Successful placement of a right arm PICC with sonographic and fluoroscopic guidance. The catheter is ready for use.   Electronically Signed   By: Richarda Overlie M.D.   On: 12/01/2014 10:24   Ir US Guide Vasc  Access Right  12/01/2014   CLINICAL DATA:  Necrotic right buttock wound.  EXAM: PERIPHERALLY INSERTED CENTRAL VENOUS CATHETER WITH ULTRASOUND AND FLUOROSCOPIC GUIDANCE  FLUOROSCOPY TIME:  30 seconds, 9 mGy.  TECHNIQUE: The procedure was explained to the patient. The risks and benefits of the procedure were discussed and the patient's questions were addressed. Informed consent was obtained from the patient. The right arm was prepped with chlorhexidine, draped in the usual sterile fashion using maximum barrier  technique (cap and mask, sterile gown, sterile gloves, large sterile sheet, hand hygiene and cutaneous antiseptic). Local anesthesia was attained by infiltration with 1% lidocaine.  Ultrasound demonstrated patency of the basilic vein, and this was documented with an image. Under real-time ultrasound guidance, this vein was accessed with a 21 gauge micropuncture needle and image documentation was performed. The needle was exchanged over a guidewire for a peel-away sheath through which a 43 cm 5 [place] dual lumen power injectable PICC was advanced, and positioned with its tip in the lower SVC. Fluoroscopy during the procedure and fluoro spot radiograph confirms appropriate catheter position. The catheter was flushed, secured to the skin with Prolene sutures, and covered with a sterile dressing.  COMPLICATIONS: None.  The patient tolerated the procedure well.  IMPRESSION: Successful placement of a right arm PICC with sonographic and fluoroscopic guidance. The catheter is ready for use.   Electronically Signed   By: Richarda OverlieAdam  Henn M.D.   On: 12/01/2014 10:24    Anti-infectives: Anti-infectives    Start     Dose/Rate Route Frequency Ordered Stop   11/30/14 1200  vancomycin (VANCOCIN) 1,750 mg in sodium chloride 0.9 % 500 mL IVPB     1,750 mg250 mL/hr over 120 Minutes Intravenous Every 24 hours 11/29/14 1546     11/29/14 1700  piperacillin-tazobactam (ZOSYN) IVPB 3.375 g     3.375 g12.5 mL/hr over 240 Minutes Intravenous 3 times per day 11/29/14 1523     11/29/14 1045  vancomycin (VANCOCIN) 2,500 mg in sodium chloride 0.9 % 500 mL IVPB     2,500 mg250 mL/hr over 120 Minutes Intravenous STAT 11/29/14 1024 11/30/14 1045   11/29/14 1030  piperacillin-tazobactam (ZOSYN) IVPB 3.375 g     3.375 g100 mL/hr over 30 Minutes Intravenous  Once 11/29/14 1022 11/29/14 1146       Assessment/Plan 1. Sepsis 2. Right buttocks infection with necrotizing fascitis  3. Debridement full thickness right buttock wound, 20 x  20 X 8 cm, Dr. Harriette Bouillonhomas Cornett, 11/29/14. 4. Debridement right buttock wound, Dr. Almond LintFaera Byerly 11/30/2014 5. Acute renal insuffiencey 6. Body mass index is 57.65 kg/(m^2). 7. Hypokalemia 8.  ABL Anemia - Hgb 6.6  Plan:  1.  I will look at the wound later today with Hydrohtherapy.  2.  Hgb low, will order and transfuse 2 units pRBC's, recheck this afternoon after blood administration, HOLD LOVENOX 3.  Continue wound care 4.  Start PT/OT to see if she would be safe to go home or if LTAC or SNF is more appropriate.  Her daughter Selena BattenKim says her mom would like to go home.  They can afford supplemental HH nursing if needed (if she's save to go home). 5.  Await improvement in WBC prior to consideration of discharge    LOS: 4 days    DORT, Treyvonne Tata 12/03/2014, 8:06 AM Pager: [phone]

## 2014-12-03 NOTE — Progress Notes (Signed)
Brief Pharmacy note: Vancomycin trough  Vanc trough 23.6 mcg/ml, (above goal range 15-20) on 1750mg  q24hr for necrotizing buttock infection  Will adjust dose to 750mg  q12.  Thank you,  Otho BellowsGreen, Jeson Camacho L PharmD Pager [phone] 12/03/2014, 5:33 PM

## 2014-12-04 LAB — CBC
HCT: 25.5 % — ABNORMAL LOW (ref 36.0–46.0)
HCT: 26.7 % — ABNORMAL LOW (ref 36.0–46.0)
Hemoglobin: 8.4 g/dL — ABNORMAL LOW (ref 12.0–15.0)
Hemoglobin: 8.7 g/dL — ABNORMAL LOW (ref 12.0–15.0)
MCH: 29.9 pg (ref 26.0–34.0)
MCH: 30.4 pg (ref 26.0–34.0)
MCHC: 32.9 g/dL (ref 30.0–36.0)
MCHC: 32.6 g/dL (ref 30.0–36.0)
MCV: 91.8 fL (ref 78.0–100.0)
MCV: 92.4 fL (ref 78.0–100.0)
Platelets: 234 10*3/uL (ref 150–400)
Platelets: 241 10*3/uL (ref 150–400)
RBC: 2.76 MIL/uL — ABNORMAL LOW (ref 3.87–5.11)
RBC: 2.91 MIL/uL — ABNORMAL LOW (ref 3.87–5.11)
RDW: 16.5 % — ABNORMAL HIGH (ref 11.5–15.5)
RDW: 16.3 % — ABNORMAL HIGH (ref 11.5–15.5)
WBC: 13 10*3/uL — ABNORMAL HIGH (ref 4.0–10.5)
WBC: 13.7 10*3/uL — AB (ref 4.0–10.5)

## 2014-12-04 LAB — TYPE AND SCREEN
ABO/RH(D): A POS
ANTIBODY SCREEN: NEGATIVE
DAT, IgG: NEGATIVE
UNIT DIVISION: 0
UNIT DIVISION: 0

## 2014-12-04 LAB — BASIC METABOLIC PANEL
ANION GAP: 4 — AB (ref 5–15)
BUN: 21 mg/dL (ref 6–23)
CHLORIDE: 110 mmol/L (ref 96–112)
CO2: 26 mmol/L (ref 19–32)
CREATININE: 1.54 mg/dL — AB (ref 0.50–1.10)
Calcium: 8 mg/dL — ABNORMAL LOW (ref 8.4–10.5)
GFR calc Af Amer: 40 mL/min — ABNORMAL LOW (ref >90)
GFR calc non Af Amer: 35 mL/min — ABNORMAL LOW (ref >90)
Glucose, Bld: 131 mg/dL — ABNORMAL HIGH (ref 70–99)
Potassium: 3.3 mmol/L — ABNORMAL LOW (ref 3.5–5.1)
Sodium: 140 mmol/L (ref 135–145)

## 2014-12-04 LAB — ANAEROBIC CULTURE

## 2014-12-04 LAB — GLUCOSE, CAPILLARY
GLUCOSE-CAPILLARY: 123 mg/dL — AB (ref 70–99)
GLUCOSE-CAPILLARY: 144 mg/dL — AB (ref 70–99)
GLUCOSE-CAPILLARY: 149 mg/dL — AB (ref 70–99)
Glucose-Capillary: 113 mg/dL — ABNORMAL HIGH (ref 70–99)
Glucose-Capillary: 141 mg/dL — ABNORMAL HIGH (ref 70–99)

## 2014-12-04 MED ORDER — OXYCODONE HCL 5 MG PO TABS
5.0000 mg | ORAL_TABLET | ORAL | Status: DC | PRN
  Administered 2014-12-04 (×2): 15 mg via ORAL
  Filled 2014-12-04 (×2): qty 3

## 2014-12-04 MED ORDER — PIPERACILLIN-TAZOBACTAM 3.375 G IVPB: 3.3750 g | Freq: Three times a day (TID) | INTRAVENOUS | Status: DC

## 2014-12-04 MED ORDER — OXYCODONE HCL 5 MG PO TABS: 5.0000 mg | ORAL_TABLET | ORAL | Status: DC | PRN

## 2014-12-04 MED ORDER — POTASSIUM CHLORIDE CRYS ER 20 MEQ PO TBCR
40.0000 meq | EXTENDED_RELEASE_TABLET | Freq: Every day | ORAL | Status: DC
  Administered 2014-12-04 – 2014-12-07 (×4): 40 meq via ORAL
  Filled 2014-12-04 (×4): qty 2

## 2014-12-04 MED ORDER — POTASSIUM CHLORIDE CRYS ER 20 MEQ PO TBCR
40.0000 meq | EXTENDED_RELEASE_TABLET | Freq: Every day | ORAL | Status: DC
Start: 2014-12-04 — End: 2014-12-07

## 2014-12-04 MED ORDER — HYDROMORPHONE HCL 1 MG/ML IJ SOLN: 1.0000 mg | INTRAMUSCULAR | Status: AC | PRN

## 2014-12-04 MED ORDER — PIPERACILLIN-TAZOBACTAM 3.375 G IVPB
3.3750 g | Freq: Three times a day (TID) | INTRAVENOUS | Status: DC
  Administered 2014-12-04 – 2014-12-07 (×9): 3.375 g via INTRAVENOUS
  Filled 2014-12-04 (×10): qty 50

## 2014-12-04 MED ORDER — VANCOMYCIN HCL IN DEXTROSE 750-5 MG/150ML-% IV SOLN: 750.0000 mg | Freq: Two times a day (BID) | INTRAVENOUS | Status: DC

## 2014-12-04 MED ORDER — HYDROMORPHONE HCL 1 MG/ML IJ SOLN
1.0000 mg | INTRAMUSCULAR | Status: DC | PRN
  Administered 2014-12-05 – 2014-12-07 (×2): 1 mg via INTRAVENOUS
  Filled 2014-12-04 (×2): qty 1

## 2014-12-04 NOTE — Progress Notes (Signed)
[facility] Progress Note  4 Days Post-Op  Subjective: Pt doing better.  She says pain still significant with dressing changes.  No N/V, tolearting diet.  Having loose BM's and she needs to have packing replaced every time she has a BM.    Objective: Vital signs in last 24 hours: Temp:  [97.5 F (36.4 C)-98.4 F (36.9 C)] 97.5 F (36.4 C) (01/29 0510) Pulse Rate:  [85-105] 92 (01/29 0510) Resp:  [16] 16 (01/29 0510) BP: (105-152)/(51-77) 143/51 mmHg (01/29 0510) SpO2:  [96 %-100 %] 96 % (01/29 0510) Last BM Date: 12/03/14  Intake/Output from previous day: 01/28 0701 - 01/29 0700 In: 3249 [P.O.:720; I.V.:1955; Blood:574] Out: 2000 [Urine:2000] Intake/Output this shift:    PE: Gen:  Alert, NAD, pleasant Card:  RRR, no M/G/R heard Pulm:  CTA, no W/R/R Abd: Soft, NT/ND, +BS, no HSM Buttock:  Will look at wound today during dressing change  Lab Results:   Recent Labs  12/04/14 12/04/14 0505  WBC 13.0* 13.7*  HGB 8.4* 8.7*  HCT 25.5* 26.7*  PLT 234 241   BMET  Recent Labs  12/03/14 0500 12/04/14 0505  NA 139 140  K 3.5 3.3*  CL 109 110  CO2 25 26  GLUCOSE 115* 131*  BUN 30* 21  CREATININE 1.67* 1.54*  CALCIUM 8.0* 8.0*   PT/INR No results for input(s): LABPROT, INR in the last 72 hours. CMP     Component Value Date/Time   NA 140 12/04/2014 0505   K 3.3* 12/04/2014 0505   CL 110 12/04/2014 0505   CO2 26 12/04/2014 0505   GLUCOSE 131* 12/04/2014 0505   BUN 21 12/04/2014 0505   CREATININE 1.54* 12/04/2014 0505   CALCIUM 8.0* 12/04/2014 0505   PROT 5.3* 12/01/2014 0347   ALBUMIN 1.7* 12/01/2014 0347   AST 50* 12/01/2014 0347   ALT 15 12/01/2014 0347   ALKPHOS 71 12/01/2014 0347   BILITOT 0.8 12/01/2014 0347   GFRNONAA 35* 12/04/2014 0505   GFRAA 40* 12/04/2014 0505   Lipase  No results found for: LIPASE     Studies/Results: No results found.  Anti-infectives: Anti-infectives    Start     Dose/Rate Route Frequency Ordered  Stop   12/03/14 2200  vancomycin (VANCOCIN) IVPB 750 mg/150 ml premix     750 mg150 mL/hr over 60 Minutes Intravenous Every 12 hours 12/03/14 1722     11/30/14 1200  vancomycin (VANCOCIN) 1,750 mg in sodium chloride 0.9 % 500 mL IVPB  Status:  Discontinued     1,750 mg250 mL/hr over 120 Minutes Intravenous Every 24 hours 11/29/14 1546 12/03/14 1704   11/29/14 1700  piperacillin-tazobactam (ZOSYN) IVPB 3.375 g     3.375 g12.5 mL/hr over 240 Minutes Intravenous 3 times per day 11/29/14 1523     11/29/14 1045  vancomycin (VANCOCIN) 2,500 mg in sodium chloride 0.9 % 500 mL IVPB     2,500 mg250 mL/hr over 120 Minutes Intravenous STAT 11/29/14 1024 11/30/14 1045   11/29/14 1030  piperacillin-tazobactam (ZOSYN) IVPB 3.375 g     3.375 g100 mL/hr over 30 Minutes Intravenous  Once 11/29/14 1022 11/29/14 1146       Assessment/Plan 1. Sepsis 2. Right buttocks infection with necrotizing fascitis  3. Debridement full thickness right buttock wound, 20 x 20 X 8 cm, Dr. Harriette Bouillonhomas Cornett, 11/29/14. 4. Debridement right buttock wound, Dr. Almond LintFaera Byerly 11/30/2014 5. Acute renal insuffiencey 6. Body mass index is 57.65 kg/(m^2). 7. Hypokalemia 8. ABL Anemia - Hgb  8.7  Plan:  1. I will look at the wound later today with Hydrohtherapy, she had her dressing changed at 6am 2. Hgb responding after 2 untits pRBC's, HOLD LOVENOX, resume tomorrow? 3. Continue wound care WD BID with dakins (last dose today) 4. Start PT/OT to see if she would be safe to go home or if LTAC.  5. Could go to LTAC today if family, patient agreeable and she has a bed available at LTAC 6.  Should continue a 10 day course of IV antibiotics - vancomycin and zosyn (today is day #6/10) 7.  Should continue hydrotherapy and BID dressing changes at [facility], more often as needed for soiling 8.  May require continued IV pain medication for a few more days 9.  She will need to have her CBC's monitored due to blood loss anemia and  leukocytosis daily until stable/improved    LOS: 5 days    DORT, [facility] 12/04/2014, 7:54 AM Pager: [phone]

## 2014-12-04 NOTE — Evaluation (Signed)
Physical Therapy Evaluation Patient Details Name: Amber Pham MRN: [id number] DOB: [birth year] Today's Date: 12/04/2014   History of Present Illness  Pt admitted 11/29/14 for right buttocks infection with necrotizing fascitis s/p I&D on 1/24 and 1/25 and currently receiving hydrotherapy.  Clinical Impression  Pt admitted with above diagnosis. Pt currently with functional limitations due to the deficits listed below (see PT Problem List). Pt will benefit from skilled PT to increase their independence and safety with mobility to allow discharge to the venue listed below.  Pt mobilizing around room well.  Pt appears close to her baseline with mobility and likely not require skilled PT upon d/c however will require f/u for wound care.  Will continue to assist with mobility in acute care.     Follow Up Recommendations No PT follow up    Equipment Recommendations  None recommended by PT    Recommendations for Other Services       Precautions / Restrictions        Mobility  Bed Mobility Overal bed mobility: Needs Assistance Bed Mobility: Supine to Sit;Sit to Supine     Supine to sit: Supervision Sit to supine: Min assist   General bed mobility comments: increased time and effort, assist for LEs likely due to bari bed  Transfers Overall transfer level: Needs assistance Equipment used: None Transfers: Sit to/from Stand Sit to Stand: Min guard         General transfer comment: utilized bed rail, min/guard for safety  Ambulation/Gait Ambulation/Gait assistance: Min guard Ambulation Distance (Feet): 44 Feet   Gait Pattern/deviations: Step-through pattern;Decreased stride length Gait velocity: decr   General Gait Details: slow pace however ambulated around room a few times with IV pole for support, declined use of assistive device (did not use previously)  Information systems managertairs            Wheelchair Mobility    Modified Rankin (Stroke Patients Only)       Balance                                              Pertinent Vitals/Pain Pain Assessment: 0-10 (premedicated for hydrotherapy, declined more meds post hydro) Pain Score: 10-Worst pain ever Pain Location: R buttock during hydrotherapy Pain Intervention(s): Monitored during session;Limited activity within patient's tolerance;Premedicated before session;Repositioned    Home Living Family/patient expects to be discharged to:: Private residence Living Arrangements: Children           Home Layout: One level Home Equipment: None      Prior Function Level of Independence: Independent         Comments: reports independent with mobility     Hand Dominance        Extremity/Trunk Assessment               Lower Extremity Assessment: Generalized weakness (limited range due to body habitus observed however functional)         Communication   Communication: No difficulties  Cognition Arousal/Alertness: Awake/alert Behavior During Therapy: WFL for tasks assessed/performed Overall Cognitive Status: Within Functional Limits for tasks assessed                      General Comments      Exercises        Assessment/Plan    PT Assessment Patient needs continued PT services  PT Diagnosis Generalized  weakness   PT Problem List Decreased strength;Decreased mobility;Decreased activity tolerance;Obesity  PT Treatment Interventions DME instruction;Gait training;Functional mobility training;Therapeutic activities;Therapeutic exercise;Patient/family education   PT Goals (Current goals can be found in the Care Plan section) Acute Rehab PT Goals PT Goal Formulation: With patient Time For Goal Achievement: 12/11/14 Potential to Achieve Goals: Good    Frequency Min 2X/week   Barriers to discharge        Co-evaluation               End of Session   Activity Tolerance: Patient tolerated treatment well Patient left: in bed;with call bell/phone within  reach Nurse Communication: Mobility status         Time: 1030-1045 PT Time Calculation (min) (ACUTE ONLY): 15 min   Charges:   PT Evaluation $Initial PT Evaluation Tier I: 1 Procedure     PT G Codes:        Amber Pham,Amber Pham 12/04/2014, 12:56 PM Amber Pham, PT, DPT 12/04/2014 Pager: [phone]

## 2014-12-04 NOTE — Discharge Summary (Signed)
[facility] Discharge Summary   Patient ID: Amber ManyMary Pham MRN: [id number] DOB/AGE: [birth year] 64 y.o.  Admit date: 11/29/2014 Discharge date: 12/04/2014  Admitting Diagnosis: Necrotizing soft tissue infection of the buttock Morbid obesity BMI 57.65 Sepsis Acute renal insufficiency  Discharge Diagnosis Patient Active Problem List   Diagnosis Date Noted  . Abscess of buttock, right 11/29/2014  . Necrotizing soft tissue infection 11/29/2014  Morbid obesity BMI 57.65 Sepsis Acute renal insufficiency  Consultants Hydrotherapy  Imaging: No results found.  Procedures Dr. Donell BeersByerly (11/30/14) - Laparoscopic Cholecystectomy with IOC Dr. Luisa Hartornett (11/29/14) - Debridement full thickness right buttock wound   Hospital Course:  64 y/o AA [facility] abscess. Pt complains 1 week history of right buttock pain drainage and foul smelling odor. Has neglected it and no care sought out until today. Complains of right buttock pain, Drainage and foul smell. Denies tobacco use and states she has no medical problems.   Patient was admitted and underwent procedures listed above.  Tolerated procedure well and was transferred to the ICU for close monitoring for sepsis.  After her second debridement the wound cleaned up nicely, but the foul smell remained.  We started dakins solution soaked on gauze packing BID.  This soon improved the wound.  She is currently having trouble with soiling of he wound so the dressings are having to be changed more frequently.  No further debridement needed.  Diet was advanced as tolerated.  ARI is improving.  Her sepsis has resolved.  On 12/03/14 her Hgb was found to be 6.6 her lovenox was held and she was given 2 units of pRBC's.  Her Hgb responded well and is now 8.7.  Her WBC is up a bit today at 13.7.  On POD #4/5, the patient was voiding well, tolerating diet, ambulating well, pain well controlled, vital signs stable,  incisions c/d/i and felt stable for [facility]  Patient will follow up in our office in 2-3 weeks with Dr. Luisa Hartornett [facility] and knows to call with questions or concerns.  She will need BID dressing changes (WD dressing changes).  She would benefit from continued hydrotherapy daily.    She will require continued IV antibiotics for 5 additional days as long as afebrile and normal WBC (Today is day 5), then can be switched to orals for an additional week.  She may require IV pain medication for a few more days given the extent of the wound and significant pain with dressing changes.  Her pain meds can be weaned to orals soon.  May need to continue fluid hydration to help with renal failure.  Monitor WBC and BMET daily until hypokalemia resolved and WBC normalized.      Medication List    TAKE these medications        HYDROmorphone 1 MG/ML injection  Commonly known as:  DILAUDID  Inject 1 mL (1 mg total) into the vein every 2 (two) hours as needed for severe pain (please use for breakthrough pain and before/after dressing changes only).     omeprazole 20 MG tablet  Commonly known as:  PRILOSEC OTC  Take 20 mg by mouth daily.     oxyCODONE 5 MG immediate release tablet  Commonly known as:  Oxy IR/ROXICODONE  Take 1-3 tablets (5-15 mg total) by mouth every 4 (four) hours as needed for moderate pain or severe pain (USE FIRST BEFORE IV EXCEPT BEFORE DRESSING CHANGES).  piperacillin-tazobactam 3.375 GM/50ML IVPB  Commonly known as:  ZOSYN  Inject 50 mLs (3.375 g total) into the vein every 8 (eight) hours.     potassium chloride SA 20 MEQ tablet  Commonly known as:  K-DUR,KLOR-CON  Take 2 tablets (40 mEq total) by mouth daily.     Vancomycin 750 MG/150ML Soln  Commonly known as:  VANCOCIN  Inject 150 mLs (750 mg total) into the vein every 12 (twelve) hours.         Follow-up Information    Follow up with Harriette Bouillon A., MD.    Specialty:  General Surgery   Why:  AFTER DISCHARGED FROM LTAC IN 2-3 WEEKS    Contact information:   [address] [place] [address] [phone]       Signed: Candiss Norse [facility] [phone]  12/04/2014, 4:27 PM

## 2014-12-04 NOTE — Progress Notes (Signed)
12/04/14 18:00 CM received callback from Lynett GrimesLyndsey Russo of Pt's insurance company stating pt's insurance WILL NOT approve LTAC at this time but will approve SNF.  Jorge NyLyndsey states she will relay this same message to the [facility] rep, Tommy.  Freddy JakschSarah Aubrina Nieman, BSN, CM [phone].

## 2014-12-04 NOTE — Progress Notes (Signed)
Hydrotherapy PT Note    12/04/14 1100  Subjective Assessment  Subjective "It just hurts while you're working on it, but it eases when you're through."  Evaluation and Treatment  Evaluation and Treatment Procedures Explained to Patient/Family Yes  Evaluation and Treatment Procedures agreed to  Wound / Incision (Open or Dehisced) 12/01/14 Incision - Open Buttocks Right;Medial large open wound, adipose tissue exposed with deep tunneling  Date First Assessed/Time First Assessed: 12/01/14 1400   Wound Type: Incision - Open  Location: Buttocks  Location Orientation: Right;Medial  Wound Description (Comments): large open wound, adipose tissue exposed with deep tunneling  Present on Admiss...  Dressing Type ABD;Gauze (Comment) (Dakins soaked kerlix, 4x4's, ABDs, hypofix)  Dressing Changed Changed  Dressing Status Clean;Dry;Intact  Dressing Change Frequency Twice a day  Site / Wound Assessment Brown;Dusky;Pink;Yellow;Bleeding;Red;Other (Comment) (adipose tissue majority of wound, deep fissures and tunnels )  % Wound base Red or Granulating 50% (difficulty to inspect deep wounds)  % Wound base Yellow 20%  % Wound base Black 20%  % Wound base Other (Comment) 10% (adipose spersed with pink and brown/dried looking tissue)  Peri-wound Assessment Edema  Tunneling (cm) 12 cm near 6 o'clock  Margins Unattached edges (unapproximated)  Drainage Amount Other (Comment) (moderate on old dressing however light brown drainage)  Drainage Description Purulent;Odor  Non-staged Wound Description Not applicable  Treatment Debridement (Selective);Hydrotherapy (Pulse lavage);Other (Comment) (per Will, PA possible last day of packing Dakins )  Hydrotherapy  Pulsed Lavage with Suction (psi) 8 psi  Pulsed Lavage with Suction - Normal Saline Used 2000 mL  Pulsed Lavage Tip Tip with splash shield  Pulsed lavage therapy - wound location R buttock from rectum to perineum  Selective Debridement  Selective  Debridement - Location R buttock wound  Selective Debridement - Tools Used Forceps  Selective Debridement - Tissue Removed very small amount of slough, dead tissue that came away easily after pulsatile lavage  Wound Therapy - Assess/Plan/Recommendations  Wound Therapy - Clinical Statement Large open wound that appears to be adipose more external with deep fissures and tunneling  nearer to the perineum. Moderate amount of light brown drainage from 6 o'clock area with pt positioning prior to starting hydrotherapy.  Very difficult to measure and perform  PLS.  Started with tunnel tip and then switched to regular tip.    Wound Therapy - Functional Problem List obesity, but  pt does get OOB and stand.  Factors Delaying/Impairing Wound Healing Immobility;Infection - systemic/local;Multiple medical problems  Hydrotherapy Plan Debridement;Patient/family education;Pulsatile lavage with suction  Wound Therapy - Frequency 6X / week  Wound Therapy - Current Recommendations PT  Wound Plan PLS x 6 x week, dressing change.  Wound Therapy Goals - Improve the function of patient's integumentary system by progressing the wound(s) through the phases of wound healing by:  Decrease Necrotic Tissue to 50  Decrease Necrotic Tissue - Progress Progressing toward goal  Increase Granulation Tissue to 50  Increase Granulation Tissue - Progress Progressing toward goal  Improve Drainage Characteristics Min  Improve Drainage Characteristics - Progress Progressing toward goal  Patient/Family will be able to  reposition off of wound  Goals/treatment plan/discharge plan were made with and agreed upon by patient/family Yes  Time For Goal Achievement 2 weeks  Wound Therapy - Potential for Goals Fair  Time: [phone]  Amber Pham, PT, DPT 12/04/2014 Pager: [phone]

## 2014-12-04 NOTE — Evaluation (Signed)
Occupational Therapy Evaluation Patient Details Name: Amber ManyMary Pham MRN: [id number] DOB: 05-20-1951 Today's Date: 12/04/2014    History of Present Illness Pt admitted 11/29/14 for right buttocks infection with necrotizing fascitis s/p I&D on 1/24 and 1/25 and currently receiving hydrotherapy.   Clinical Impression   Pt was admitted for the above.  She will benefit from skilled OT to increase safety and independence with adls.  Pt was mod I with ADLs prior to admission and struggled with her socks.  She currently needs overall min to mod A with ADLs.  Goals in acute are set for supervision level.    Follow Up Recommendations  LTACH    Equipment Recommendations  3 in 1 bedside comode    Recommendations for Other Services       Precautions / Restrictions Precautions Precautions: Fall Restrictions Weight Bearing Restrictions: No      Mobility Bed Mobility Overal bed mobility: Needs Assistance Bed Mobility: Supine to Sit;Sit to Supine     Supine to sit: Supervision Sit to supine: Min assist   General bed mobility comments: increased time.  Assist for LLE  Transfers Overall transfer level: Needs assistance Equipment used: None Transfers: Sit to/from Stand Sit to Stand: Min guard         General transfer comment: used bed rail and IV pole.  Min guard for safety    Balance                                            ADL Overall ADL's : Needs assistance/impaired                                       General ADL Comments: Pt ambulated to bathroom with min guard, using IV pole. She did not need to use toilet nor complete grooming tasks.  She is able to perform UB ADLs and grooming seated with set up.  She sat EOB and I educated her on reacher and sock aide.  Pt does not feel she can lean forward for ADLs due to lightheadedness. Pt currently needs min A for LB bathing and mod A for LB dressing with use of AE.  She can tolerate sitting EOB  but is unable to tolerate sitting in chair due to pain. When getting back in bed, she tends to lift LLE and climb in.  Educated to try to sit EOB and get in that way for decresed effort; min A was given for this.  HR 126 and sats 88%.  Breathing was labored when returning from bathroom.  Demonstrated leaning on wall, for rest break.  Encouraged pursed lip breathing:  sats increased from 88 - 91% after a minute     Vision                     Perception     Praxis      Pertinent Vitals/Pain Pain Assessment: 0-10 (premedicated for hydrotherapy, declined more meds post hydro) Pain Score: 6  Pain Location: R buttocks Pain Intervention(s): Monitored during session     Hand Dominance     Extremity/Trunk Assessment Upper Extremity Assessment Upper Extremity Assessment: Overall WFL for tasks assessed          Communication Communication Communication: No difficulties   Cognition Arousal/Alertness: Awake/alert Behavior  During Therapy: WFL for tasks assessed/performed Overall Cognitive Status: Within Functional Limits for tasks assessed                     General Comments       Exercises       Shoulder Instructions      Home Living Family/patient expects to be discharged to:: Private residence Living Arrangements: Children           Home Layout: One level               Home Equipment: None          Prior Functioning/Environment Level of Independence: Independent        Comments: reports independent with mobility    OT Diagnosis: Generalized weakness   OT Problem List: Decreased strength;Decreased activity tolerance;Pain;Impaired balance (sitting and/or standing);Decreased knowledge of use of DME or AE   OT Treatment/Interventions: Self-care/ADL training;DME and/or AE instruction;Patient/family education;Balance training;Therapeutic activities;Energy conservation    OT Goals(Current goals can be found in the care plan section) Acute  Rehab OT Goals Patient Stated Goal: get back to being independent OT Goal Formulation: With patient Time For Goal Achievement: 12/11/14 Potential to Achieve Goals: Good ADL Goals Pt Will Perform Grooming: with supervision;standing Pt Will Perform Lower Body Bathing: with adaptive equipment;sit to/from stand;with supervision Pt Will Perform Lower Body Dressing: with supervision;with adaptive equipment;sit to/from stand Pt Will Transfer to Toilet: with supervision;bedside commode;ambulating Additional ADL Goal #1: pt will initiate at lease one rest break as needed for energy conservation  OT Frequency: Min 2X/week   Barriers to D/C:            Co-evaluation              End of Session    Activity Tolerance: Patient tolerated treatment well Patient left: in bed;with call bell/phone within reach;with family/visitor present   Time: 1610-9604 OT Time Calculation (min): 22 min Charges:  OT General Charges $OT Visit: 1 Procedure OT Evaluation $Initial OT Evaluation Tier I: 1 Procedure G-Codes:    [facility] 2014-12-09, 1:49 PM   Marica Otter, OTR/L [phone] 12/09/2014

## 2014-12-04 NOTE — Progress Notes (Signed)
12/04/14 09:22 CM received call from [employer] rep, Lynett GrimesLyndsey Russo (pt's insurance provider) who states pt is up to date with insurance premium payments and is coverage is active.  Jorge NyLyndsey states pt is out-of-network for [facility] but is in network for [facility] and the cost may be less than [facility]'s quote.  CM called [facility] rep, Tommy to please pursue the referral.  CM called daughter, Delanna AhmadiBowanna, to update family.  Waiting for callback from [facility] rep.  Freddy JakschSarah Brylon Brenning, BSN, CM [phone].

## 2014-12-04 NOTE — Progress Notes (Signed)
   The depth of this between the muscle is 12 cm.  She had a fair amount of brown purulent drainage from the lower portion about 6 o'clock on this picture.  Continue wound care and Hydrotherapy.

## 2014-12-04 NOTE — Care Management Note (Signed)
12/04/14 Sandford CrazeNora Thaxton Pelley RN,BSN,NCM  Spoke with Tommy from [facility] at 4:15pm.  He is still working with pts insurance company to get approval [facility].  Orvilla Fusommy will call the floor RN if approval is given today to have pt transferred.  No other CM needs noted at this time.

## 2014-12-05 LAB — CULTURE, BLOOD (ROUTINE X 2)
Culture: NO GROWTH
Culture: NO GROWTH

## 2014-12-05 LAB — GLUCOSE, CAPILLARY
Glucose-Capillary: 125 mg/dL — ABNORMAL HIGH (ref 70–99)
Glucose-Capillary: 129 mg/dL — ABNORMAL HIGH (ref 70–99)
Glucose-Capillary: 123 mg/dL — ABNORMAL HIGH (ref 70–99)
Glucose-Capillary: 134 mg/dL — ABNORMAL HIGH (ref 70–99)

## 2014-12-05 MED ORDER — LIP MEDEX EX OINT
TOPICAL_OINTMENT | CUTANEOUS | Status: AC
  Administered 2014-12-05: 13:00:00
  Filled 2014-12-05: qty 7

## 2014-12-05 NOTE — Progress Notes (Signed)
Patient ID: Amber Pham, female   DOB: 02/23/1951, 64 y.o.   MRN: [id number] 5 Days Post-Op  Subjective: No C/O this AM, dressing changed with hydrotherapy earlier this AM  Objective: Vital signs in last 24 hours: Temp:  [97.8 F (36.6 C)-98.8 F (37.1 C)] 97.8 F (36.6 C) (01/30 0447) Pulse Rate:  [90-102] 90 (01/30 0447) Resp:  [16-18] 18 (01/30 0447) BP: (141-155)/(51-59) 155/57 mmHg (01/30 0447) SpO2:  [97 %-98 %] 97 % (01/30 0447) Last BM Date: 12/03/14  Intake/Output from previous day: 01/29 0701 - 01/30 0700 In: 480 [P.O.:480] Out: 1650 [Urine:1650] Intake/Output this shift:    General appearance: alert, cooperative, no distress and morbidly obese  R butttock wound examined, clean, minimal granulation, no erythema or unusual drainage  Lab Results:   Recent Labs  12/04/14 12/04/14 0505  WBC 13.0* 13.7*  HGB 8.4* 8.7*  HCT 25.5* 26.7*  PLT 234 241   BMET  Recent Labs  12/03/14 0500 12/04/14 0505  NA 139 140  K 3.5 3.3*  CL 109 110  CO2 25 26  GLUCOSE 115* 131*  BUN 30* 21  CREATININE 1.67* 1.54*  CALCIUM 8.0* 8.0*     Studies/Results: No results found.  Anti-infectives: Anti-infectives    Start     Dose/Rate Route Frequency Ordered Stop   12/04/14 1700  piperacillin-tazobactam (ZOSYN) IVPB 3.375 g     3.375 g12.5 mL/hr over 240 Minutes Intravenous Every 8 hours 12/04/14 1618     12/04/14 0000  Vancomycin (VANCOCIN) 750 MG/150ML SOLN     750 mg150 mL/hr over 60 Minutes Intravenous Every 12 hours 12/04/14 1604     12/04/14 0000  piperacillin-tazobactam (ZOSYN) 3.375 GM/50ML IVPB     3.375 g12.5 mL/hr over 240 Minutes Intravenous Every 8 hours 12/04/14 1625     12/03/14 2200  vancomycin (VANCOCIN) IVPB 750 mg/150 ml premix     750 mg150 mL/hr over 60 Minutes Intravenous Every 12 hours 12/03/14 1722     11/30/14 1200  vancomycin (VANCOCIN) 1,750 mg in sodium chloride 0.9 % 500 mL IVPB  Status:  Discontinued     1,750 mg250 mL/hr over 120 Minutes  Intravenous Every 24 hours 11/29/14 1546 12/03/14 1704   11/29/14 1700  piperacillin-tazobactam (ZOSYN) IVPB 3.375 g  Status:  Discontinued     3.375 g12.5 mL/hr over 240 Minutes Intravenous 3 times per day 11/29/14 1523 12/04/14 0833   11/29/14 1045  vancomycin (VANCOCIN) 2,500 mg in sodium chloride 0.9 % 500 mL IVPB     2,500 mg250 mL/hr over 120 Minutes Intravenous STAT 11/29/14 1024 11/30/14 1045   11/29/14 1030  piperacillin-tazobactam (ZOSYN) IVPB 3.375 g     3.375 g100 mL/hr over 30 Minutes Intravenous  Once 11/29/14 1022 11/29/14 1146      Assessment/Plan: s/p Procedure(s): IRRIGATION AND DEBRIDEMENT BUTTOCK WOUND Necrotizing fasciitis Improving with current therapy    LOS: 6 days    Braylon Lemmons T 12/05/2014

## 2014-12-05 NOTE — Progress Notes (Signed)
   12/05/14 1200  Subjective Assessment  Patient and Family Stated Goals to heal up  Date of Onset 11/29/14  Prior Treatments I/D by surgery  Evaluation and Treatment  Evaluation and Treatment Procedures Explained to Patient/Family Yes  Evaluation and Treatment Procedures agreed to  Wound / Incision (Open or Dehisced) 12/01/14 Incision - Open Buttocks Right;Medial large open wound, adipose tissue exposed with deep tunneling  Date First Assessed/Time First Assessed: 12/01/14 1400   Wound Type: Incision - Open  Location: Buttocks  Location Orientation: Right;Medial  Wound Description (Comments): large open wound, adipose tissue exposed with deep tunneling  Present on Admiss...  Dressing Type ABD;Gauze (Comment);Moist to dry (NS kerlix, ABD, hypafix)  Dressing Changed Changed  Dressing Status Clean;Dry;Intact  Dressing Change Frequency Twice a day (once by PT)  Site / Wound Assessment Brown;Granulation tissue;Red;Bleeding;Pale  % Wound base Red or Granulating 55%  % Wound base Yellow 25%  % Wound base Black 20% (brown/black necrotic adipose)  Peri-wound Assessment Induration  Margins Unattached edges (unapproximated)  Closure None  Drainage Amount Copious  Drainage Description Serosanguineous  Non-staged Wound Description Not applicable  Treatment Cleansed;Debridement (Selective);Hydrotherapy (Pulse lavage)  Hydrotherapy  Pulsed Lavage with Suction (psi) 8 psi  Pulsed Lavage with Suction - Normal Saline Used 1000 mL  Pulsed Lavage Tip Tip with splash shield  Pulsed lavage therapy - wound location R buttock from rectum to perineum  Selective Debridement  Selective Debridement - Location R buttock wound  Selective Debridement - Tools Used Forceps;Scissors  Selective Debridement - Tissue Removed small amount necrotic adipose and  eschar removed from wound edge  Wound Therapy - Assess/Plan/Recommendations  Wound Therapy - Clinical Statement Large open wound that appears to be adipose  more external with deep fissures and tunneling  nearer to the perineum. Moderate amount of light brown drainage from 6 o'clock area with pt positioning prior to starting hydrotherapy.  Very difficult to measure and perform  PLS.  Started with tunnel tip and then switched to regular tip.    Wound Therapy - Functional Problem List obesity, but  pt does get OOB and stand.  Factors Delaying/Impairing Wound Healing Immobility;Infection - systemic/local;Multiple medical problems  Hydrotherapy Plan Debridement;Patient/family education;Pulsatile lavage with suction  Wound Therapy - Frequency 6X / week  Wound Therapy - Current Recommendations PT  Wound Therapy - Follow Up Recommendations Skilled nursing facility  Wound Plan PLS x 6 x week, dressing change.  Wound Therapy Goals - Improve the function of patient's integumentary system by progressing the wound(s) through the phases of wound healing by:  Decrease Necrotic Tissue to 50  Decrease Necrotic Tissue - Progress Progressing toward goal  Increase Granulation Tissue to 50  Increase Granulation Tissue - Progress Progressing toward goal  Improve Drainage Characteristics Min  Improve Drainage Characteristics - Progress Progressing toward goal  Goals/treatment plan/discharge plan were made with and agreed upon by patient/family Yes  Time For Goal Achievement 2 weeks  Wound Therapy - Potential for Goals Fair

## 2014-12-05 NOTE — Progress Notes (Signed)
Noted partial thickness draining wound to right knee. Patient stated she "scraped" her knee when she fell at home. Base of wound pink. Wound was cleaned with normal saline and covered with xeroform gauze, 2x2 and tegaderm. Wound measures 2x4.5 cm.

## 2014-12-06 LAB — GLUCOSE, CAPILLARY
GLUCOSE-CAPILLARY: 160 mg/dL — AB (ref 70–99)
Glucose-Capillary: 97 mg/dL (ref 70–99)
Glucose-Capillary: 95 mg/dL (ref 70–99)
Glucose-Capillary: 146 mg/dL — ABNORMAL HIGH (ref 70–99)
Glucose-Capillary: 105 mg/dL — ABNORMAL HIGH (ref 70–99)

## 2014-12-06 LAB — CBC
HCT: 25 % — ABNORMAL LOW (ref 36.0–46.0)
Hemoglobin: 8 g/dL — ABNORMAL LOW (ref 12.0–15.0)
MCH: 30 pg (ref 26.0–34.0)
MCHC: 32 g/dL (ref 30.0–36.0)
MCV: 93.6 fL (ref 78.0–100.0)
Platelets: 256 K/uL (ref 150–400)
RBC: 2.67 MIL/uL — ABNORMAL LOW (ref 3.87–5.11)
RDW: 16.2 % — ABNORMAL HIGH (ref 11.5–15.5)
WBC: 9.9 K/uL (ref 4.0–10.5)

## 2014-12-06 LAB — CREATININE, SERUM
Creatinine, Ser: 1.44 mg/dL — ABNORMAL HIGH (ref 0.50–1.10)
GFR calc Af Amer: 44 mL/min — ABNORMAL LOW (ref >90)
GFR calc non Af Amer: 38 mL/min — ABNORMAL LOW (ref >90)

## 2014-12-06 LAB — VANCOMYCIN, TROUGH: Vancomycin Tr: 22.6 ug/mL — ABNORMAL HIGH (ref 10.0–20.0)

## 2014-12-06 MED ORDER — SODIUM CHLORIDE 0.9 % IJ SOLN
10.0000 mL | INTRAMUSCULAR | Status: DC | PRN
  Administered 2014-12-06 – 2014-12-07 (×2): 10 mL
  Filled 2014-12-06: qty 40

## 2014-12-06 MED ORDER — VANCOMYCIN HCL 10 G IV SOLR
1250.0000 mg | INTRAVENOUS | Status: DC
  Administered 2014-12-06: 1250 mg via INTRAVENOUS
  Filled 2014-12-06: qty 1250

## 2014-12-06 NOTE — Progress Notes (Signed)
ANTIBIOTIC CONSULT NOTE - Follow Up  Pharmacy Consult for Vancomcyin, Zosyn Indication: Necrotizing right buttock abscess/infection, Code Sepsis  No Known Allergies  Patient Measurements: Height: 5\' 4"  (162.6 cm) Weight: (!) 336 lb (152.409 kg) IBW/kg (Calculated) : 54.7   Vital Signs: Temp: 98.7 F (37.1 C) (01/31 0446) Temp Source: Oral (01/31 0446) BP: 146/67 mmHg (01/31 0446) Pulse Rate: 87 (01/31 0446) Intake/Output from previous day: 01/30 0701 - 01/31 0700 In: 770 [P.O.:720; IV Piggyback:50] Out: 3250 [Urine:3250] Intake/Output from this shift: Total I/O In: 240 [P.O.:240] Out: 600 [Urine:600]  Labs:  Recent Labs  12/04/14 12/04/14 0505 12/06/14 0640  WBC 13.0* 13.7* 9.9  HGB 8.4* 8.7* 8.0*  PLT 234 241 256  CREATININE  --  1.54* 1.44*   Estimated Creatinine Clearance: 59.2 mL/min (by C-G formula based on Cr of 1.44).  Recent Labs  12/03/14 1550 12/06/14 0935  VANCOTROUGH 23.6* 22.6*     Assessment: 63 y/oF who [facility] of sore of the right buttocks, progressively getting larger, having a malodorous drainage, and with some areas of black discoloration. Code sepsis initiated. MD indicates this is a necrotizing abscess/infection, and patient emergently taken to OR for surgical debridement. Pharmacy consulted to assist with dosing of Vancomycin and Zosyn for this patient.   1/24 >> Vancomycin >> 1/24 >> Zosyn >>   Tmax: afeb since 1/25 WBCs: improved to WNL Renal: SCr some improvement 1.44 (no baseline available), normalized CrCl 2645ml/min Normalized PCT: 14.76 (1/24) Lactic acid: 3.24 (1/24)  1/24 blood x 2: NGTD 1/24 urine: NGF 1/24 abscess: multiple org, none predom, no S.aureus or Grp A Strep. 1/25 MRSA PCR screen: negative  Drug level / dose changes info: 1/28 VT 23.6 on 1750 q24h (prior to 4th dose, after 2500mg  LD in OR), changed to 750mg  q12. SCr improving. 1/31 0930 VT = 22.6 mcg/ml On 750mg  IV q12h, prior  to 6th dose, changed to 1250mg  IV q24h  Goal of Therapy:  Vancomycin trough level 15-20 mcg/ml  Appropriate antibiotic dosing for renal function and indication Eradication of infection  Plan:  Day #8 vancomycin/zosyn  Trough elevated today, Change vancomycin to 1250mg  IV q24h for est pk = 33, trough = 15.  Update discharge vancomycin dose  Continue zosyn 3.375gm IV q8h over 4h infusion  Juliette Alcideustin Adelaida Reindel, PharmD, BCPS.   Pager: [phone] 12/06/2014  10:52 AM

## 2014-12-06 NOTE — Progress Notes (Signed)
Patient ID: Amber Pham, female   DOB: 11-25-1950, 64 y.o.   MRN: [id number] 6 Days Post-Op  Subjective: Patient without complaints. Dressing just changed. Wound clean by report.  Objective: Vital signs in last 24 hours: Temp:  [97.3 F (36.3 C)-98.7 F (37.1 C)] 98.7 F (37.1 C) (01/31 0446) Pulse Rate:  [87-105] 87 (01/31 0446) Resp:  [19-20] 20 (01/31 0446) BP: (122-156)/(59-67) 146/67 mmHg (01/31 0446) SpO2:  [96 %-99 %] 96 % (01/31 0446) Last BM Date: 12/05/14  Intake/Output from previous day: 01/30 0701 - 01/31 0700 In: 770 [P.O.:720; IV Piggyback:50] Out: 3250 [Urine:3250] Intake/Output this shift:    General appearance: alert, cooperative and no distress Incision/Wound: clean yesterday. Did not reexamine today  Lab Results:   Recent Labs  12/04/14 0505 12/06/14 0640  WBC 13.7* 9.9  HGB 8.7* 8.0*  HCT 26.7* 25.0*  PLT 241 256   BMET  Recent Labs  12/04/14 0505 12/06/14 0640  NA 140  --   K 3.3*  --   CL 110  --   CO2 26  --   GLUCOSE 131*  --   BUN 21  --   CREATININE 1.54* 1.44*  CALCIUM 8.0*  --      Studies/Results: No results found.  Anti-infectives: Anti-infectives    Start     Dose/Rate Route Frequency Ordered Stop   12/04/14 1700  piperacillin-tazobactam (ZOSYN) IVPB 3.375 g     3.375 g12.5 mL/hr over 240 Minutes Intravenous Every 8 hours 12/04/14 1618     12/04/14 0000  Vancomycin (VANCOCIN) 750 MG/150ML SOLN     750 mg150 mL/hr over 60 Minutes Intravenous Every 12 hours 12/04/14 1604     12/04/14 0000  piperacillin-tazobactam (ZOSYN) 3.375 GM/50ML IVPB     3.375 g12.5 mL/hr over 240 Minutes Intravenous Every 8 hours 12/04/14 1625     12/03/14 2200  vancomycin (VANCOCIN) IVPB 750 mg/150 ml premix     750 mg150 mL/hr over 60 Minutes Intravenous Every 12 hours 12/03/14 1722     11/30/14 1200  vancomycin (VANCOCIN) 1,750 mg in sodium chloride 0.9 % 500 mL IVPB  Status:  Discontinued     1,750 mg250 mL/hr over 120 Minutes Intravenous  Every 24 hours 11/29/14 1546 12/03/14 1704   11/29/14 1700  piperacillin-tazobactam (ZOSYN) IVPB 3.375 g  Status:  Discontinued     3.375 g12.5 mL/hr over 240 Minutes Intravenous 3 times per day 11/29/14 1523 12/04/14 0833   11/29/14 1045  vancomycin (VANCOCIN) 2,500 mg in sodium chloride 0.9 % 500 mL IVPB     2,500 mg250 mL/hr over 120 Minutes Intravenous STAT 11/29/14 1024 11/30/14 1045   11/29/14 1030  piperacillin-tazobactam (ZOSYN) IVPB 3.375 g     3.375 g100 mL/hr over 30 Minutes Intravenous  Once 11/29/14 1022 11/29/14 1146      Assessment/Plan: s/p Procedure(s): IRRIGATION AND DEBRIDEMENT BUTTOCK WOUND Continued improvement. White count now normalized. Possible LTAC this week   LOS: 7 days    Tycho Cheramie T 12/06/2014

## 2014-12-07 ENCOUNTER — Other Ambulatory Visit (HOSPITAL_COMMUNITY): Payer: No Typology Code available for payment source

## 2014-12-07 ENCOUNTER — Institutional Professional Consult (permissible substitution)
Admission: AD | Admit: 2014-12-07 | Discharge: 2015-01-04 | Disposition: A | Discharge status: Home Health | Payer: No Typology Code available for payment source | Source: Ambulatory Visit | Attending: Internal Medicine | Admitting: Internal Medicine

## 2014-12-07 DIAGNOSIS — I509 Heart failure, unspecified: Secondary | ICD-10-CM

## 2014-12-07 LAB — GLUCOSE, CAPILLARY
GLUCOSE-CAPILLARY: 118 mg/dL — AB (ref 70–99)
Glucose-Capillary: 132 mg/dL — ABNORMAL HIGH (ref 70–99)

## 2014-12-07 MED ORDER — SACCHAROMYCES BOULARDII 250 MG PO CAPS: 250.0000 mg | ORAL_CAPSULE | Freq: Two times a day (BID) | ORAL | Status: AC

## 2014-12-07 MED ORDER — ACETAMINOPHEN-CODEINE #3 300-30 MG PO TABS: 1.0000 | ORAL_TABLET | ORAL | Status: DC | PRN

## 2014-12-07 MED ORDER — SACCHAROMYCES BOULARDII 250 MG PO CAPS
250.0000 mg | ORAL_CAPSULE | Freq: Two times a day (BID) | ORAL | Status: DC
  Administered 2014-12-07: 250 mg via ORAL
  Filled 2014-12-07 (×2): qty 1

## 2014-12-07 MED ORDER — ACETAMINOPHEN-CODEINE #3 300-30 MG PO TABS: 1.0000 | ORAL_TABLET | ORAL | Status: AC | PRN

## 2014-12-07 NOTE — Progress Notes (Signed)
  Hydrotherapy treatment note. 12/07/14 1308  Subjective Assessment  Subjective how does it look  Evaluation and Treatment  Evaluation and Treatment Procedures Explained to Patient/Family Yes  Wound / Incision (Open or Dehisced) 12/01/14 Incision - Open Buttocks Right;Medial large open wound, adipose tissue exposed with deep tunneling  Date First Assessed/Time First Assessed: 12/01/14 1400   Wound Type: Incision - Open  Location: Buttocks  Location Orientation: Right;Medial  Wound Description (Comments): large open wound, adipose tissue exposed with deep tunneling  Present on Admiss...  Dressing Type ABD;Gauze (Comment);Moist to dry  Dressing Changed Changed  Dressing Status Clean;Dry;Intact  Dressing Change Frequency Twice a day (once by PT)  Site / Wound Assessment Brown;Granulation tissue;Red;Bleeding;Pale  % Wound base Red or Granulating 70%  % Wound base Yellow 15%  % Wound base Black 15%  Peri-wound Assessment Maceration;Induration;Erythema (blanchable);Denuded (appears that tape is iritating surrounding area)  Wound Length (cm) 10 cm  Wound Width (cm) 14 cm  Wound Depth (cm) 12 cm (tunneling at 6:00 and incenter of wound)  Margins Unattached edges (unapproximated)  Closure None  Drainage Amount Copious  Drainage Description Serosanguineous;Purulent  Non-staged Wound Description Not applicable  Treatment Cleansed;Hydrotherapy (Pulse lavage);Debridement (Selective);Packing (Saline gauze)  Hydrotherapy  Pulsed Lavage with Suction (psi) 8 psi  Pulsed Lavage with Suction - Normal Saline Used 1000 mL  Pulsed Lavage Tip Tunneling tip (and diverter tip)  Pulsed lavage therapy - wound location R buttock from rectum to perineum  Selective Debridement  Selective Debridement - Location R buttock wound  Selective Debridement - Tools Used Forceps;Scissors  Selective Debridement - Tissue Removed small amount necrotic adipose and  eschar removed from wound edge  Wound Therapy -  Assess/Plan/Recommendations  Wound Therapy - Clinical Statement Large open wound that appears to be adipose more external with deep fissures and tunneling  nearer to the perineum. Moderate amount of light brown drainage from 6 o'clock area with pt positioning prior to starting hydrotherapy.  Very difficult to measure and perform  PLS.  Started with tunnel tip and then switched to regular tip.  superficial wound is much pinker, still with areas of necrotic adipose, dark drainage from tunnekling and fissures.  Wound Therapy - Functional Problem List obesity, but  pt does get OOB and stand.  Factors Delaying/Impairing Wound Healing Immobility;Infection - systemic/local;Multiple medical problems  Hydrotherapy Plan Debridement;Patient/family education;Pulsatile lavage with suction  Wound Therapy - Frequency 6X / week  Wound Therapy - Current Recommendations PT  Wound Therapy - Follow Up Recommendations Skilled nursing facility  Wound Plan PLS x 6 x week, dressing change.  Wound Therapy Goals - Improve the function of patient's integumentary system by progressing the wound(s) through the phases of wound healing by:  Decrease Necrotic Tissue to 10  Decrease Necrotic Tissue - Progress Updated due to goal met  Increase Granulation Tissue to 90  Increase Granulation Tissue - Progress Updated due to goal met  Improve Drainage Characteristics Min  Improve Drainage Characteristics - Progress Progressing toward goal  Patient/Family will be able to  reposition off of wound  Patient/Family Instruction Goal - Progress Progressing toward goal  Goals/treatment plan/discharge plan were made with and agreed upon by patient/family Yes  Time For Goal Achievement 2 weeks  Wound Therapy - Potential for Goals Apolonio Schneiders PT [phone]

## 2014-12-07 NOTE — Discharge Instructions (Signed)
Transfer to LTAC.

## 2014-12-07 NOTE — Discharge Summary (Signed)
Physician Discharge Summary  Patient ID: Amber Pham MRN: [id number] DOB/AGE: 11/28/1950 64 y.o.  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 Pt complains 1 week history of right buttock pain drainage and foul smelling odor. Has neglected it and no care sought out until today. Complains of right buttock pain, Drainage and foul smell. Denies tobacco use and states she has no medical problems.   Patient was admitted and underwent procedures listed above. Tolerated procedure well and was transferred to the ICU for close monitoring for sepsis. After her second debridement the wound cleaned up nicely, but the foul smell remained. We started Dakins solution soaked on gauze packing BID. This soon improved the wound. She is currently having trouble with soiling of he wound so the dressings are having to be changed more frequently. No further debridement needed. Diet was advanced as tolerated. ARI is improving. Her sepsis has resolved. On 12/03/14 her Hgb was found to be 6.6 her Lovenox was held and she was given 2 units of pRBC's. Her Hgb responded well and is now 8.7. Her WBC is up a bit today at 13.7. On POD #4/5, the patient was voiding well, tolerating diet, ambulating well, pain well controlled, vital signs stable, incisions c/d/i and felt stable for discharge to [facility] Patient will follow up in our office in 2-3 weeks with Dr. Luisa Hart or after discharge from [facility] and  knows to call with questions or concerns. She will need BID dressing changes (WD dressing changes). She would benefit from continued hydrotherapy daily.  She will need some debridement, but this can be done at the bedside at the time of hydrotherapy.  We are stopping her antibiotics for now.  She has had 8 days of IV vancomycin and Zosyn.  Plans are for transfer to the LTAC  For ongoing treatment, including hydrotherapy with dressing changes.  We are doing the dressing changes BID with hydrotherapy once a day.  The wound is up to 12 CM deep.  As she granulates in she may need a skin graft, but we are not close to that decision at this time.  She is very large and requires a bariatric bed.  We have ordered PT for ambulation and may also need OT.  I was planning to check a prealbumin with her labs tomorrow but I am sure she is malnourished at this point. She developed worsening anemia and required transfusion.  She did not have any defined bleed from anyplace aside from her open wound and debridement of the open necrotic wounds. Procedures Dr. Donell Beers (11/30/14) - Laparoscopic Cholecystectomy with IOC Dr. Luisa Hart (11/29/14) - Debridement full thickness right buttock wound   Hospital Course:   Significant Diagnostic Studies:  MULTIPLE ORGANISMS PRESENT, NONE PREDOMINANT  Note: NO STAPHYLOCOCCUS AUREUS ISOLATED NO GROUP A STREP (S.PYOGENES) ISOLATED       Blood and urine cultures were negative.    Discharge Exam: Blood pressure 141/50, pulse 89, temperature 99.3 F (37.4 C), temperature source Oral, resp. rate 19, height  (1.626 m), weight 152.409 kg (336 lb), SpO2 99 %.  General appearance: alert, cooperative and no distress Resp: clear to auscultation bilaterally Wound:    The area at 6 o'clock is 12 CM deep.  The area at 9-12 o'clock can be debrided at the bedside with hydrotherapy.  This is today's picture 12/07/14.   This is post op day one 11/30/14, before we took her back to the OR  for debridement the second time.    CBC Latest Ref Rng 12/06/2014 12/04/2014 12/04/2014  WBC 4.0 - 10.5 K/uL 9.9 13.7(H) 13.0(H)  Hemoglobin 12.0 - 15.0 g/dL 8.0(L) 8.7(L) 8.4(L)  Hematocrit 36.0 - 46.0 % 25.0(L) 26.7(L) 25.5(L)  Platelets 150 - 400 K/uL 256 241 234   CMP Latest Ref Rng 12/06/2014 12/04/2014 12/03/2014  Glucose 70 - 99 mg/dL - 829(F131(H) 621(H115(H)  BUN 6 - 23 mg/dL - 21 08(M30(H)  Creatinine 0.50 - 1.10 mg/dL 5.78(I1.44(H) 6.96(E1.54(H) 9.52(W1.67(H)  Sodium 135 - 145 mmol/L - 140 139  Potassium 3.5 - 5.1 mmol/L - 3.3(L) 3.5  Chloride 96 - 112 mmol/L - 110 109  CO2 19 - 32 mmol/L - 26 25  Calcium 8.4 - 10.5 mg/dL - 8.0(L) 8.0(L)  Total Protein 6.0 - 8.3 g/dL - - -  Total Bilirubin 0.3 - 1.2 mg/dL - - -  Alkaline Phos 39 - 117 U/L - - -  AST 0 - 37 U/L - - -  ALT 0 - 35 U/L - - -     Disposition:  LTAC    Medication List    TAKE these medications        acetaminophen-codeine 300-30 MG per tablet  Commonly known as:  TYLENOL #3  Take 1-2 tablets by mouth every 4 (four) hours as needed for moderate pain.     HYDROmorphone 1 MG/ML injection  Commonly known as:  DILAUDID  Inject 1 mL (1 mg total) into the vein every 2 (two) hours as needed for severe pain (please use for breakthrough pain and before/after dressing changes only).     omeprazole 20 MG tablet  Commonly known as:  PRILOSEC OTC  Take 20 mg by mouth daily.     saccharomyces boulardii 250 MG capsule  Commonly known as:  FLORASTOR  Take 1 capsule (250 mg total) by mouth 2 (two) times daily.           Follow-up Information    Follow up with Harriette BouillonORNETT,THOMAS A., MD.   Specialty:  General Surgery   Why:  AFTER DISCHARGED FROM LTAC IN 2-3 WEEKS    Contact information:   [address] [place] [address] [phone]       Signed: Sherrie GeorgeJENNINGS,Clarine Elrod 12/07/2014, 64:43 PM

## 2014-12-07 NOTE — Progress Notes (Signed)
CARE MANAGEMENT NOTE 12/07/2014  Patient:  Amber Pham, Amber Pham   Account Number:  [id number]  Date Initiated:  12/01/2014  Documentation initiated by:  DAVIS,RHONDA  Subjective/Objective Assessment:   Sepsis  Right buttocks infection with necrotizing fascitis    Debridement full thickness right buttock wound, 20 x 20  X 8 cm, Dr. Erroll Luna, 11/29/14.  Debridement right buttock wound by myself 11/30/2014  Acute renal insuffiencey  Body     Action/Plan:   tbd may require rehab due to area and surface amount of wounds   Anticipated DC Date:  12/04/2014   Anticipated DC Plan:  LONG TERM ACUTE CARE (LTAC)  In-house referral  NA      DC Planning Services  CM consult      PAC Choice  NA   Choice offered to / List presented to:  NA   DME arranged  NA      DME agency  NA     [place] arranged  NA      [place] agency  NA   Status of service:  In process, will continue to follow Medicare Important Message given?  NA - LOS <3 / Initial given by admissions (If response is "NO", the following Medicare IM given date fields will be blank) Date Medicare IM given:   Medicare IM given by:   Date Additional Medicare IM given:   Additional Medicare IM given by:    Discharge Disposition:    Per UR Regulation:  Reviewed for med. necessity/level of care/duration of stay  If discussed at [place] of Stay Meetings, dates discussed:    Comments:  [id number]/Rhonda Eldridge Dace, [place], [place] [phone] Chart Reviewed. Discharge needs at time of review: None present will follow for needs. [facility] dr Laren Everts will cover.  [facility] spec notified/will Creig Hines with [facility] notified and needs of dc summary.  12/04/14 09:22 CM received call from [employer], Olivia Mackie (pt's insurance provider) who states pt is up to date with insurance premium payments and is coverage is active. Tobin Chad states pt is out-of-network for [facility] but is in network for [facility] and the cost may  be less than [facility]'s quote.  CM called [facility] rep, Tommy to please pursue the referral.  CM called daughter, Rodrigo Ran, to update family. Waiting for callback from [facility] rep.  Mariane Masters, BSN, [place] [phone].   12/03/14 15:40 Cm met with pt and daughter in room to discuss LTAC.  CM relayed information from [facility] rep, Seth Bake who states: Pt is behind in her premium payments for insurance and would need to pay the three months prior to insurance covering; pt would then have an $11,500.00 deductible; and pt would then have 50% coverage. Pt states her daughter, Maudie Mercury, takes care of the insurance and please call her to verify the above deficit in premiums.  CM called Kim at both her personal number and her work number but there was no answer at either and her personal mailbox is full.  Regardless, of whether or not premiums are up-to-date, pt states at this time she is not interested in LTAC with this lak of coverage.  CM notified MD.  Will continue to monitor for progress and disposition. Mariane Masters, BSN, [place].  12/03/14 12:00 CM received request from MD (on unit) to please look into LTAC.  Referral given to [facility] rep, Seth Bake and  [employer], Konrad Dolores. CM waiting for callbacks from both.  Mariane Masters, BSN, CM [phone].   Jan. 26, 16  Rhonda L. Rosana Hoes, RN, BSN, CCM [facility] [employer] [phone]

## 2014-12-07 NOTE — Progress Notes (Signed)
7 Days Post-Op  Subjective: She is doing well, understands we are looking for LTAC as possible d/c plan. She wants to walk some and will get PT to help with that.  Tolerating dressings, but it is very painful to her.  Objective: Vital signs in last 24 hours: Temp:  [98.4 F (36.9 C)-99.3 F (37.4 C)] 99.3 F (37.4 C) (02/01 0417) Pulse Rate:  [89-100] 89 (02/01 0417) Resp:  [16-20] 19 (02/01 0417) BP: (141-148)/(50-64) 141/50 mmHg (02/01 0417) SpO2:  [98 %-100 %] 99 % (02/01 0417) Last BM Date: 12/06/14 840 Po  Diet: cardiac 3 stools recorded TM 99.3 No labs today, yesterday labs are stable with improving renal function.   Intake/Output from previous day: 01/31 0701 - 02/01 0700 In: 840 [P.O.:840] Out: 2700 [Urine:2700] Intake/Output this shift:    General appearance: alert, cooperative and no distress Resp: clear to auscultation bilaterally Open wound:   I think this area to the right o'clock to 12 o'clock may need more debridement.   Lab Results:   Recent Labs  12/06/14 0640  WBC 9.9  HGB 8.0*  HCT 25.0*  PLT 256    BMET  Recent Labs  12/06/14 0640  CREATININE 1.44*   PT/INR No results for input(s): LABPROT, INR in the last 72 hours.   Recent Labs Lab 12/01/14 0347  AST 50*  ALT 15  ALKPHOS 71  BILITOT 0.8  PROT 5.3*  ALBUMIN 1.7*     Lipase  No results found for: LIPASE   Studies/Results: No results found.  Medications: . famotidine  20 mg Oral BID  . feeding supplement (PRO-STAT SUGAR FREE 64)  30 mL Oral TID WC  . feeding supplement (RESOURCE BREEZE)  1 Container Oral TID BM  . insulin aspart  0-15 Units Subcutaneous 6 times per day  . multivitamin with minerals  1 tablet Oral Daily  . piperacillin-tazobactam (ZOSYN)  IV  3.375 g Intravenous Q8H  . potassium chloride  40 mEq Oral Daily  . vancomycin  1,250 mg Intravenous Q24H    Assessment/Plan Necrotizing soft tissue infection of the buttock Morbid obesity BMI  57.65 Sepsis Acute renal insufficiency Anemia with transfusion   Plan:  [facility] continue [facility], will discuss more debridement, add probiotic.  Not allot of drainage from site like last Friday.  Culture shows:    MULTIPLE ORGANISMS PRESENT, NONE PREDOMINANT  Note: NO STAPHYLOCOCCUS AUREUS ISOLATED NO GROUP A STREP (S.PYOGENES) ISOLATED      I am going to stop vancomycin.  Urine and blood cultures are negative.  She has had 8 days of Vancomycin and Zosyn. Recheck all her labs in [place], discuss antibiotic Rx?  Can we stop them all?   LOS: 8 days    Shrihaan Porzio 12/07/2014

## 2014-12-07 NOTE — Progress Notes (Signed)
Physical Therapy Treatment Patient Details Name: Earl ManyMary Merkey MRN: [id number] DOB: 05/28/1951 Today's Date: 12/07/2014    History of Present Illness Pt admitted 11/29/14 for right buttocks infection with necrotizing fascitis s/p I&D on 1/24 and 1/25 and currently receiving hydrotherapy.    PT Comments    Patient is very deconditioned, noted wheezing after mobility.   Follow Up Recommendations  No PT follow up     Equipment Recommendations  None recommended by PT    Recommendations for Other Services       Precautions / Restrictions Precautions Precautions: Fall Precaution Comments: monitor sats , may need O2    Mobility  Bed Mobility   Bed Mobility: Supine to Sit;Sit to Supine     Supine to sit: Min assist Sit to supine: Min assist   General bed mobility comments: increased time.  Assist for both legs  Transfers Overall transfer level: Needs assistance Equipment used:  (IV pole) Transfers: Sit to/from Stand Sit to Stand: Min guard         General transfer comment: used bed rail and IV pole.  Min guard for safety  Ambulation/Gait Ambulation/Gait assistance: Min guard Ambulation Distance (Feet): 20 Feet (x2 )         General Gait Details: slow pace however ambulatedto/from BR slowlydecline AD use.   Stairs            Wheelchair Mobility    Modified Rankin (Stroke Patients Only)       Balance                                    Cognition Arousal/Alertness: Awake/alert                          Exercises      General Comments        Pertinent Vitals/Pain Pain Score: 6  Pain Location: R buttock Pain Descriptors / Indicators: Burning;Discomfort    Home Living                      Prior Function            PT Goals (current goals can now be found in the care plan section) Progress towards PT goals: Progressing toward goals    Frequency  Min 2X/week    PT Plan Current plan remains  appropriate    Co-evaluation             End of Session   Activity Tolerance: Patient tolerated treatment well Patient left: in bed;with call bell/phone within reach     Time: 0843-0910 PT Time Calculation (min) (ACUTE ONLY): 27 min  Charges:  $Gait Training: 8-22 mins $Self Care/Home Management: 8-22                    G Codes:      Rada HayHill, Varonica Siharath Elizabeth 12/07/2014, 1:08 PM

## 2014-12-08 LAB — COMPREHENSIVE METABOLIC PANEL
ALBUMIN: 1.5 g/dL — AB (ref 3.5–5.2)
ALT: 23 U/L (ref 0–35)
AST: 45 U/L — ABNORMAL HIGH (ref 0–37)
Alkaline Phosphatase: 56 U/L (ref 39–117)
Anion gap: 3 — ABNORMAL LOW (ref 5–15)
BUN: 14 mg/dL (ref 6–23)
CHLORIDE: 114 mmol/L — AB (ref 96–112)
CO2: 26 mmol/L (ref 19–32)
Calcium: 8 mg/dL — ABNORMAL LOW (ref 8.4–10.5)
Creatinine, Ser: 1.69 mg/dL — ABNORMAL HIGH (ref 0.50–1.10)
GFR calc Af Amer: 36 mL/min — ABNORMAL LOW (ref >90)
GFR, EST NON AFRICAN AMERICAN: 31 mL/min — AB (ref >90)
GLUCOSE: 115 mg/dL — AB (ref 70–99)
Potassium: 4.2 mmol/L (ref 3.5–5.1)
Sodium: 143 mmol/L (ref 135–145)
Total Bilirubin: 0.3 mg/dL (ref 0.3–1.2)
Total Protein: 5.7 g/dL — ABNORMAL LOW (ref 6.0–8.3)

## 2014-12-08 LAB — CBC WITH DIFFERENTIAL/PLATELET
BASOS ABS: 0 10*3/uL (ref 0.0–0.1)
Basophils Relative: 0 % (ref 0–1)
EOS PCT: 1 % (ref 0–5)
Eosinophils Absolute: 0.1 10*3/uL (ref 0.0–0.7)
HCT: 23.6 % — ABNORMAL LOW (ref 36.0–46.0)
Hemoglobin: 7.6 g/dL — ABNORMAL LOW (ref 12.0–15.0)
LYMPHS ABS: 1.4 10*3/uL (ref 0.7–4.0)
LYMPHS PCT: 18 % (ref 12–46)
MCH: 29.8 pg (ref 26.0–34.0)
MCHC: 32.2 g/dL (ref 30.0–36.0)
MCV: 92.5 fL (ref 78.0–100.0)
Monocytes Absolute: 0.4 10*3/uL (ref 0.1–1.0)
Monocytes Relative: 5 % (ref 3–12)
NEUTROS ABS: 6.1 10*3/uL (ref 1.7–7.7)
Neutrophils Relative %: 76 % (ref 43–77)
Platelets: 331 10*3/uL (ref 150–400)
RBC: 2.55 MIL/uL — ABNORMAL LOW (ref 3.87–5.11)
RDW: 16.5 % — AB (ref 11.5–15.5)
WBC: 7.9 10*3/uL (ref 4.0–10.5)

## 2014-12-08 LAB — PROTIME-INR
INR: 1.28 (ref 0.00–1.49)
PROTHROMBIN TIME: 16.2 s — AB (ref 11.6–15.2)

## 2014-12-08 LAB — LIPID PANEL
Cholesterol: 111 mg/dL (ref 0–200)
HDL: 21 mg/dL — ABNORMAL LOW (ref >39)
LDL CALC: 64 mg/dL (ref 0–99)
Total CHOL/HDL Ratio: 5.3 RATIO
Triglycerides: 132 mg/dL (ref <150)
VLDL: 26 mg/dL (ref 0–40)

## 2014-12-08 LAB — SEDIMENTATION RATE: SED RATE: 118 mm/h — AB (ref 0–22)

## 2014-12-08 LAB — PROCALCITONIN: Procalcitonin: 0.38 ng/mL

## 2014-12-08 LAB — IRON AND TIBC
Iron: 29 ug/dL — ABNORMAL LOW (ref 42–145)
Saturation Ratios: 15 % — ABNORMAL LOW (ref 20–55)
TIBC: 198 ug/dL — ABNORMAL LOW (ref 250–470)
UIBC: 169 ug/dL (ref 125–400)

## 2014-12-08 LAB — VITAMIN B12: Vitamin B-12: >2000 pg/mL — ABNORMAL HIGH (ref 211–911)

## 2014-12-08 LAB — CLOSTRIDIUM DIFFICILE BY PCR: Toxigenic C. Difficile by PCR: NEGATIVE

## 2014-12-08 LAB — PHOSPHORUS: PHOSPHORUS: 3.5 mg/dL (ref 2.3–4.6)

## 2014-12-08 LAB — C-REACTIVE PROTEIN: CRP: 9.2 mg/dL — ABNORMAL HIGH (ref <0.60)

## 2014-12-08 LAB — MAGNESIUM: Magnesium: 2 mg/dL (ref 1.5–2.5)

## 2014-12-08 LAB — T4, FREE: FREE T4: 0.76 ng/dL — AB (ref 0.80–1.80)

## 2014-12-08 LAB — TSH: TSH: 5.594 u[IU]/mL — ABNORMAL HIGH (ref 0.350–4.500)

## 2014-12-09 LAB — CBC
HCT: 24.1 % — ABNORMAL LOW (ref 36.0–46.0)
Hemoglobin: 7.6 g/dL — ABNORMAL LOW (ref 12.0–15.0)
MCH: 29.7 pg (ref 26.0–34.0)
MCHC: 31.5 g/dL (ref 30.0–36.0)
MCV: 94.1 fL (ref 78.0–100.0)
PLATELETS: 331 10*3/uL (ref 150–400)
RBC: 2.56 MIL/uL — ABNORMAL LOW (ref 3.87–5.11)
RDW: 16 % — AB (ref 11.5–15.5)
WBC: 7.8 10*3/uL (ref 4.0–10.5)

## 2014-12-09 LAB — BASIC METABOLIC PANEL
Anion gap: 4 — ABNORMAL LOW (ref 5–15)
BUN: 12 mg/dL (ref 6–23)
CO2: 27 mmol/L (ref 19–32)
Calcium: 8.2 mg/dL — ABNORMAL LOW (ref 8.4–10.5)
Chloride: 109 mmol/L (ref 96–112)
Creatinine, Ser: 1.7 mg/dL — ABNORMAL HIGH (ref 0.50–1.10)
GFR, EST AFRICAN AMERICAN: 36 mL/min — AB (ref >90)
GFR, EST NON AFRICAN AMERICAN: 31 mL/min — AB (ref >90)
Glucose, Bld: 98 mg/dL (ref 70–99)
Potassium: 4.4 mmol/L (ref 3.5–5.1)
SODIUM: 140 mmol/L (ref 135–145)

## 2014-12-09 LAB — HEMOGLOBIN A1C
Hgb A1c MFr Bld: 6.2 % — ABNORMAL HIGH (ref 4.8–5.6)
Mean Plasma Glucose: 131 mg/dL

## 2014-12-09 LAB — BRAIN NATRIURETIC PEPTIDE: B NATRIURETIC PEPTIDE 5: 167.9 pg/mL — AB (ref 0.0–100.0)

## 2014-12-09 LAB — FOLATE RBC
FOLATE, RBC: 1902 ng/mL (ref >498)
Folate, Hemolysate: 454.5 ng/mL
Hematocrit: 23.9 % — ABNORMAL LOW (ref 34.0–46.6)

## 2014-12-10 NOTE — Progress Notes (Signed)
Late entry: dsg changed to rt buttock, wound gently cleansed with NS. Wound is indurated, tunneling present, moderate amt serosanguinous dng. 10 cm deep and 10x14 cm, with approx half red/granulating & remainder equal parts yellow & black.  Wet to dry dsg applied. Tavious Griesinger, [employer]

## 2014-12-11 LAB — RENAL FUNCTION PANEL
Albumin: 1.5 g/dL — ABNORMAL LOW (ref 3.5–5.2)
Anion gap: 4 — ABNORMAL LOW (ref 5–15)
BUN: 9 mg/dL (ref 6–23)
CO2: 30 mmol/L (ref 19–32)
Calcium: 8.3 mg/dL — ABNORMAL LOW (ref 8.4–10.5)
Chloride: 107 mmol/L (ref 96–112)
Creatinine, Ser: 1.66 mg/dL — ABNORMAL HIGH (ref 0.50–1.10)
GFR calc non Af Amer: 32 mL/min — ABNORMAL LOW (ref >90)
GFR, EST AFRICAN AMERICAN: 37 mL/min — AB (ref >90)
Glucose, Bld: 102 mg/dL — ABNORMAL HIGH (ref 70–99)
Phosphorus: 3.9 mg/dL (ref 2.3–4.6)
Potassium: 4.2 mmol/L (ref 3.5–5.1)
SODIUM: 141 mmol/L (ref 135–145)

## 2014-12-11 LAB — HEMOGLOBIN AND HEMATOCRIT, BLOOD
HEMATOCRIT: 22.8 % — AB (ref 36.0–46.0)
Hemoglobin: 7.2 g/dL — ABNORMAL LOW (ref 12.0–15.0)

## 2014-12-11 LAB — PREPARE RBC (CROSSMATCH)

## 2014-12-11 LAB — ABO/RH: ABO/RH(D): A POS

## 2014-12-12 LAB — HEMOGLOBIN A1C
HEMOGLOBIN A1C: 6.3 % — AB (ref 4.8–5.6)
Mean Plasma Glucose: 134 mg/dL

## 2014-12-12 LAB — OCCULT BLOOD X 1 CARD TO LAB, STOOL: Fecal Occult Bld: NEGATIVE

## 2014-12-12 LAB — HEMOGLOBIN AND HEMATOCRIT, BLOOD
HEMATOCRIT: 24 % — AB (ref 36.0–46.0)
Hemoglobin: 7.7 g/dL — ABNORMAL LOW (ref 12.0–15.0)

## 2014-12-14 LAB — CBC
HEMATOCRIT: 25 % — AB (ref 36.0–46.0)
Hemoglobin: 8.1 g/dL — ABNORMAL LOW (ref 12.0–15.0)
MCH: 29.7 pg (ref 26.0–34.0)
MCHC: 32.4 g/dL (ref 30.0–36.0)
MCV: 91.6 fL (ref 78.0–100.0)
PLATELETS: 341 10*3/uL (ref 150–400)
RBC: 2.73 MIL/uL — AB (ref 3.87–5.11)
RDW: 15.9 % — AB (ref 11.5–15.5)
WBC: 5.3 10*3/uL (ref 4.0–10.5)

## 2014-12-14 LAB — BASIC METABOLIC PANEL
ANION GAP: 7 (ref 5–15)
BUN: 8 mg/dL (ref 6–23)
CALCIUM: 8.6 mg/dL (ref 8.4–10.5)
CO2: 28 mmol/L (ref 19–32)
Chloride: 104 mmol/L (ref 96–112)
Creatinine, Ser: 1.86 mg/dL — ABNORMAL HIGH (ref 0.50–1.10)
GFR calc Af Amer: 32 mL/min — ABNORMAL LOW (ref >90)
GFR, EST NON AFRICAN AMERICAN: 28 mL/min — AB (ref >90)
Glucose, Bld: 126 mg/dL — ABNORMAL HIGH (ref 70–99)
Potassium: 4.7 mmol/L (ref 3.5–5.1)
SODIUM: 139 mmol/L (ref 135–145)

## 2014-12-15 LAB — TYPE AND SCREEN
ABO/RH(D): A POS
Antibody Screen: NEGATIVE
Unit division: 0
Unit division: 0

## 2014-12-17 LAB — CBC
HEMATOCRIT: 25.8 % — AB (ref 36.0–46.0)
Hemoglobin: 8 g/dL — ABNORMAL LOW (ref 12.0–15.0)
MCH: 29.4 pg (ref 26.0–34.0)
MCHC: 31 g/dL (ref 30.0–36.0)
MCV: 94.9 fL (ref 78.0–100.0)
Platelets: 381 10*3/uL (ref 150–400)
RBC: 2.72 MIL/uL — ABNORMAL LOW (ref 3.87–5.11)
RDW: 15.9 % — AB (ref 11.5–15.5)
WBC: 4.9 10*3/uL (ref 4.0–10.5)

## 2014-12-17 LAB — BASIC METABOLIC PANEL
Anion gap: 8 (ref 5–15)
BUN: 12 mg/dL (ref 6–23)
CALCIUM: 8.8 mg/dL (ref 8.4–10.5)
CHLORIDE: 106 mmol/L (ref 96–112)
CO2: 27 mmol/L (ref 19–32)
CREATININE: 1.69 mg/dL — AB (ref 0.50–1.10)
GFR calc Af Amer: 36 mL/min — ABNORMAL LOW (ref >90)
GFR calc non Af Amer: 31 mL/min — ABNORMAL LOW (ref >90)
GLUCOSE: 114 mg/dL — AB (ref 70–99)
Potassium: 4.6 mmol/L (ref 3.5–5.1)
Sodium: 141 mmol/L (ref 135–145)

## 2014-12-21 LAB — CBC WITH DIFFERENTIAL/PLATELET
Basophils Absolute: 0 10*3/uL (ref 0.0–0.1)
Basophils Relative: 0 % (ref 0–1)
EOS PCT: 4 % (ref 0–5)
Eosinophils Absolute: 0.3 10*3/uL (ref 0.0–0.7)
HCT: 28.6 % — ABNORMAL LOW (ref 36.0–46.0)
Hemoglobin: 9.1 g/dL — ABNORMAL LOW (ref 12.0–15.0)
Lymphocytes Relative: 28 % (ref 12–46)
Lymphs Abs: 2 10*3/uL (ref 0.7–4.0)
MCH: 30.3 pg (ref 26.0–34.0)
MCHC: 31.8 g/dL (ref 30.0–36.0)
MCV: 95.3 fL (ref 78.0–100.0)
Monocytes Absolute: 0.6 10*3/uL (ref 0.1–1.0)
Monocytes Relative: 8 % (ref 3–12)
Neutro Abs: 4.2 10*3/uL (ref 1.7–7.7)
Neutrophils Relative %: 60 % (ref 43–77)
Platelets: 556 10*3/uL — ABNORMAL HIGH (ref 150–400)
RBC: 3 MIL/uL — AB (ref 3.87–5.11)
RDW: 16 % — ABNORMAL HIGH (ref 11.5–15.5)
WBC: 7.1 10*3/uL (ref 4.0–10.5)

## 2014-12-21 LAB — BASIC METABOLIC PANEL
Anion gap: 7 (ref 5–15)
BUN: 21 mg/dL (ref 6–23)
CHLORIDE: 106 mmol/L (ref 96–112)
CO2: 25 mmol/L (ref 19–32)
Calcium: 9.1 mg/dL (ref 8.4–10.5)
Creatinine, Ser: 1.57 mg/dL — ABNORMAL HIGH (ref 0.50–1.10)
GFR calc Af Amer: 39 mL/min — ABNORMAL LOW (ref >90)
GFR calc non Af Amer: 34 mL/min — ABNORMAL LOW (ref >90)
Glucose, Bld: 122 mg/dL — ABNORMAL HIGH (ref 70–99)
Potassium: 4.8 mmol/L (ref 3.5–5.1)
Sodium: 138 mmol/L (ref 135–145)

## 2014-12-28 LAB — CBC
HCT: 29.1 % — ABNORMAL LOW (ref 36.0–46.0)
Hemoglobin: 9 g/dL — ABNORMAL LOW (ref 12.0–15.0)
MCH: 28.8 pg (ref 26.0–34.0)
MCHC: 30.9 g/dL (ref 30.0–36.0)
MCV: 93 fL (ref 78.0–100.0)
Platelets: 399 10*3/uL (ref 150–400)
RBC: 3.13 MIL/uL — ABNORMAL LOW (ref 3.87–5.11)
RDW: 15.5 % (ref 11.5–15.5)
WBC: 8.6 10*3/uL (ref 4.0–10.5)

## 2014-12-28 LAB — RENAL FUNCTION PANEL
Albumin: 2.7 g/dL — ABNORMAL LOW (ref 3.5–5.2)
Anion gap: 5 (ref 5–15)
BUN: 23 mg/dL (ref 6–23)
CALCIUM: 9.4 mg/dL (ref 8.4–10.5)
CHLORIDE: 104 mmol/L (ref 96–112)
CO2: 26 mmol/L (ref 19–32)
Creatinine, Ser: 1.19 mg/dL — ABNORMAL HIGH (ref 0.50–1.10)
GFR calc Af Amer: 55 mL/min — ABNORMAL LOW (ref >90)
GFR, EST NON AFRICAN AMERICAN: 48 mL/min — AB (ref >90)
GLUCOSE: 109 mg/dL — AB (ref 70–99)
Phosphorus: 3.9 mg/dL (ref 2.3–4.6)
Potassium: 4.4 mmol/L (ref 3.5–5.1)
SODIUM: 135 mmol/L (ref 135–145)

## 2015-01-04 LAB — BASIC METABOLIC PANEL
Anion gap: 8 (ref 5–15)
BUN: 11 mg/dL (ref 6–23)
CALCIUM: 9.8 mg/dL (ref 8.4–10.5)
CO2: 26 mmol/L (ref 19–32)
CREATININE: 1.24 mg/dL — AB (ref 0.50–1.10)
Chloride: 102 mmol/L (ref 96–112)
GFR calc Af Amer: 52 mL/min — ABNORMAL LOW (ref >90)
GFR calc non Af Amer: 45 mL/min — ABNORMAL LOW (ref >90)
Glucose, Bld: 120 mg/dL — ABNORMAL HIGH (ref 70–99)
Potassium: 4.4 mmol/L (ref 3.5–5.1)
Sodium: 136 mmol/L (ref 135–145)

## 2015-01-04 LAB — CBC
HEMATOCRIT: 29.1 % — AB (ref 36.0–46.0)
HEMOGLOBIN: 9.1 g/dL — AB (ref 12.0–15.0)
MCH: 29.5 pg (ref 26.0–34.0)
MCHC: 31.3 g/dL (ref 30.0–36.0)
MCV: 94.5 fL (ref 78.0–100.0)
Platelets: 423 10*3/uL — ABNORMAL HIGH (ref 150–400)
RBC: 3.08 MIL/uL — AB (ref 3.87–5.11)
RDW: 15.3 % (ref 11.5–15.5)
WBC: 7.6 10*3/uL (ref 4.0–10.5)

## 2016-08-26 IMAGING — US IR US GUIDE VASC ACCESS RIGHT
1 series · 1 of 1 positions shown · non-contrast
Comparison: none

CLINICAL DATA: Necrotic right buttock wound.

EXAM:
PERIPHERALLY INSERTED CENTRAL VENOUS CATHETER WITH ULTRASOUND AND
FLUOROSCOPIC GUIDANCE
FLUOROSCOPY TIME:  30 seconds, 9 mGy.
TECHNIQUE: The procedure was explained to the patient. The risks and benefits
of the procedure were discussed and the patient's questions were
addressed. Informed consent was obtained from the patient. The right
arm was prepped with chlorhexidine, draped in the usual sterile
fashion using maximum barrier technique (cap and mask, sterile gown,
sterile gloves, large sterile sheet, hand hygiene and cutaneous
antiseptic). Local anesthesia was attained by infiltration with 1%
lidocaine.

[Series 1: ir fluoro guide cv midline picc *right* · 1 of 1 slices shown]
[im 1/1]
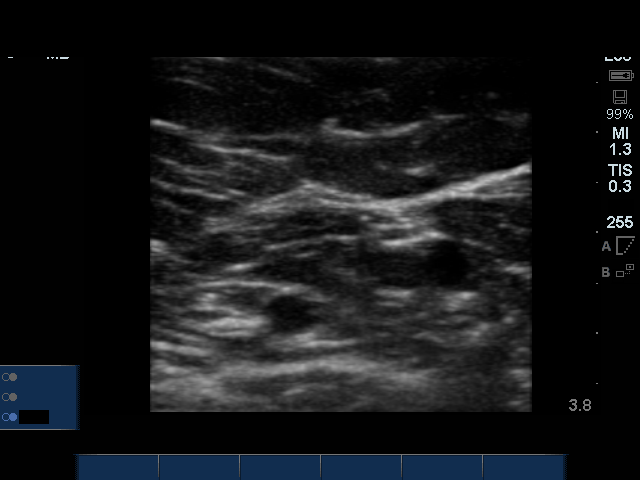

[1 of 1 positions shown; findings below may reference images not displayed]

Ultrasound demonstrated patency of the basilic vein, and this was
documented with an image. Under real-time ultrasound guidance, this
vein was accessed with a 21 gauge micropuncture needle and image
documentation was performed. The needle was exchanged over a
guidewire for a peel-away sheath through which a 43 cm 5 French dual
lumen power injectable PICC was advanced, and positioned with its
tip in the lower SVC. Fluoroscopy during the procedure and fluoro
spot radiograph confirms appropriate catheter position. The catheter
was flushed, secured to the skin with Prolene sutures, and covered
with a sterile dressing.

COMPLICATIONS:
None.  The patient tolerated the procedure well.
IMPRESSION: Successful placement of a right arm PICC with sonographic and
fluoroscopic guidance. The catheter is ready for use.

## 2016-09-01 IMAGING — CR DG CHEST 1V PORT
1 series · 1 of 1 positions shown · non-contrast
Comparison: 11/29/2014

CLINICAL DATA: Initial encounter for shortness of breath for 1 day.

EXAM:
PORTABLE CHEST - 1 VIEW

[AP]
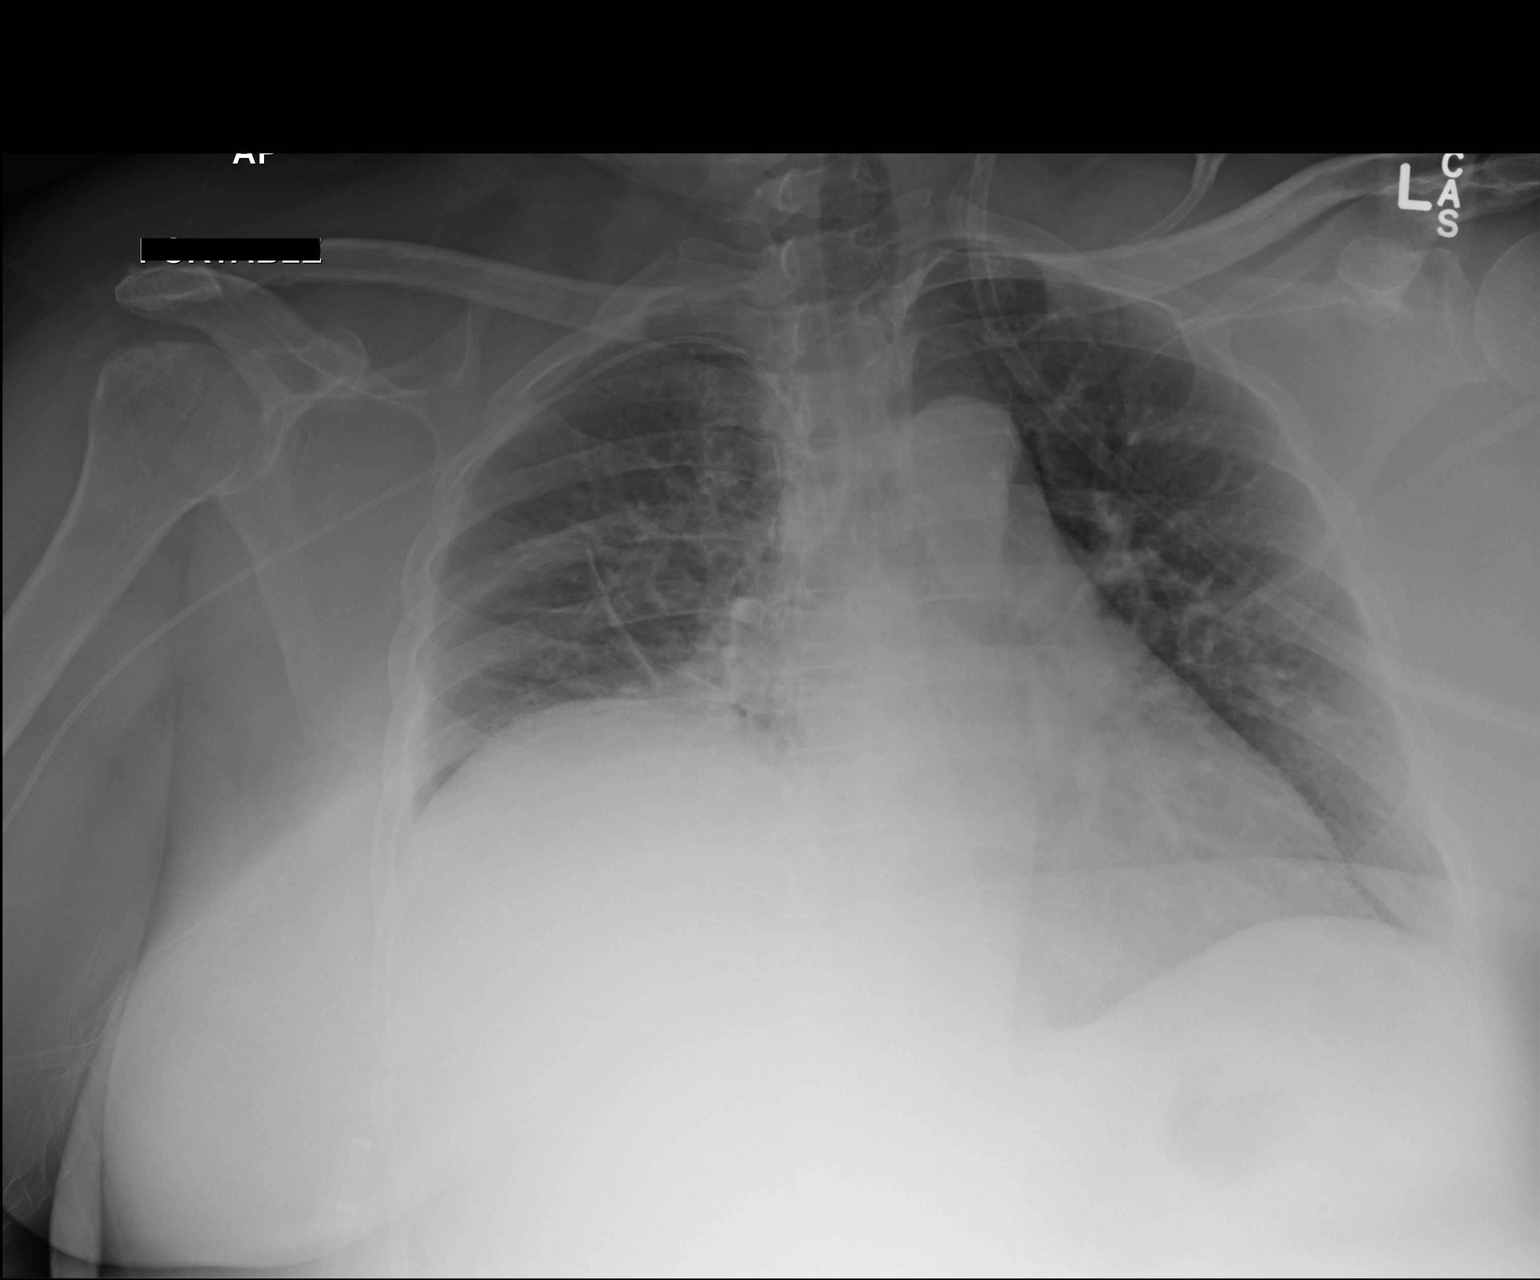

[1 of 1 positions shown; findings below may reference images not displayed]

FINDINGS: Right-sided PICC line terminates at the mid SVC. Moderate right
hemidiaphragm elevation. Cardiomegaly accentuated by AP portable
technique. No pleural effusion or pneumothorax. Low lung volumes
with resultant pulmonary interstitial prominence. Mild right base
atelectasis.
IMPRESSION: Cardiomegaly and low lung volumes with development of right base
atelectasis adjacent to a moderately elevated right hemidiaphragm.

## 2016-09-22 DIAGNOSIS — D649 Anemia, unspecified: Secondary | ICD-10-CM | POA: Diagnosis not present

## 2016-09-22 DIAGNOSIS — Z23 Encounter for immunization: Secondary | ICD-10-CM | POA: Diagnosis not present

## 2016-09-22 DIAGNOSIS — R7303 Prediabetes: Secondary | ICD-10-CM | POA: Diagnosis not present

## 2016-09-22 DIAGNOSIS — E039 Hypothyroidism, unspecified: Secondary | ICD-10-CM | POA: Diagnosis not present

## 2016-09-22 DIAGNOSIS — E785 Hyperlipidemia, unspecified: Secondary | ICD-10-CM | POA: Diagnosis not present

## 2016-09-22 DIAGNOSIS — Z0000 Encounter for general adult medical examination without abnormal findings: Secondary | ICD-10-CM | POA: Diagnosis not present

## 2016-09-22 DIAGNOSIS — I10 Essential (primary) hypertension: Secondary | ICD-10-CM | POA: Diagnosis not present

## 2017-10-02 DIAGNOSIS — I10 Essential (primary) hypertension: Secondary | ICD-10-CM | POA: Diagnosis not present

## 2017-10-02 DIAGNOSIS — Z23 Encounter for immunization: Secondary | ICD-10-CM | POA: Diagnosis not present

## 2017-10-02 DIAGNOSIS — E039 Hypothyroidism, unspecified: Secondary | ICD-10-CM | POA: Diagnosis not present

## 2017-10-02 DIAGNOSIS — R7303 Prediabetes: Secondary | ICD-10-CM | POA: Diagnosis not present

## 2017-10-02 DIAGNOSIS — D649 Anemia, unspecified: Secondary | ICD-10-CM | POA: Diagnosis not present

## 2017-10-02 DIAGNOSIS — E785 Hyperlipidemia, unspecified: Secondary | ICD-10-CM | POA: Diagnosis not present

## 2017-10-02 DIAGNOSIS — Z0000 Encounter for general adult medical examination without abnormal findings: Secondary | ICD-10-CM | POA: Diagnosis not present

## 2017-10-02 DIAGNOSIS — Z6841 Body Mass Index (BMI) 40.0 and over, adult: Secondary | ICD-10-CM | POA: Diagnosis not present

## 2018-04-02 DIAGNOSIS — I10 Essential (primary) hypertension: Secondary | ICD-10-CM | POA: Diagnosis not present

## 2018-04-02 DIAGNOSIS — Z6841 Body Mass Index (BMI) 40.0 and over, adult: Secondary | ICD-10-CM | POA: Diagnosis not present

## 2018-04-02 DIAGNOSIS — R7303 Prediabetes: Secondary | ICD-10-CM | POA: Diagnosis not present

## 2018-04-02 DIAGNOSIS — E039 Hypothyroidism, unspecified: Secondary | ICD-10-CM | POA: Diagnosis not present

## 2018-04-02 DIAGNOSIS — E785 Hyperlipidemia, unspecified: Secondary | ICD-10-CM | POA: Diagnosis not present

## 2018-08-23 DIAGNOSIS — Z23 Encounter for immunization: Secondary | ICD-10-CM | POA: Diagnosis not present

## 2018-10-28 DIAGNOSIS — Z6841 Body Mass Index (BMI) 40.0 and over, adult: Secondary | ICD-10-CM | POA: Diagnosis not present

## 2018-10-28 DIAGNOSIS — I10 Essential (primary) hypertension: Secondary | ICD-10-CM | POA: Diagnosis not present

## 2018-10-28 DIAGNOSIS — E785 Hyperlipidemia, unspecified: Secondary | ICD-10-CM | POA: Diagnosis not present

## 2018-10-28 DIAGNOSIS — R7303 Prediabetes: Secondary | ICD-10-CM | POA: Diagnosis not present

## 2018-10-28 DIAGNOSIS — Z0000 Encounter for general adult medical examination without abnormal findings: Secondary | ICD-10-CM | POA: Diagnosis not present

## 2018-10-28 DIAGNOSIS — D649 Anemia, unspecified: Secondary | ICD-10-CM | POA: Diagnosis not present

## 2018-10-28 DIAGNOSIS — E039 Hypothyroidism, unspecified: Secondary | ICD-10-CM | POA: Diagnosis not present

## 2018-11-21 DIAGNOSIS — R635 Abnormal weight gain: Secondary | ICD-10-CM | POA: Diagnosis not present

## 2018-11-21 DIAGNOSIS — K219 Gastro-esophageal reflux disease without esophagitis: Secondary | ICD-10-CM | POA: Diagnosis not present

## 2018-11-21 DIAGNOSIS — Z1211 Encounter for screening for malignant neoplasm of colon: Secondary | ICD-10-CM | POA: Diagnosis not present

## 2018-12-07 DIAGNOSIS — Z1212 Encounter for screening for malignant neoplasm of rectum: Secondary | ICD-10-CM | POA: Diagnosis not present

## 2018-12-07 DIAGNOSIS — Z1211 Encounter for screening for malignant neoplasm of colon: Secondary | ICD-10-CM | POA: Diagnosis not present

## 2019-08-12 DIAGNOSIS — Z23 Encounter for immunization: Secondary | ICD-10-CM | POA: Diagnosis not present

## 2019-10-29 DIAGNOSIS — E785 Hyperlipidemia, unspecified: Secondary | ICD-10-CM | POA: Diagnosis not present

## 2019-10-29 DIAGNOSIS — R7303 Prediabetes: Secondary | ICD-10-CM | POA: Diagnosis not present

## 2019-10-29 DIAGNOSIS — Z0000 Encounter for general adult medical examination without abnormal findings: Secondary | ICD-10-CM | POA: Diagnosis not present

## 2019-10-29 DIAGNOSIS — E039 Hypothyroidism, unspecified: Secondary | ICD-10-CM | POA: Diagnosis not present

## 2019-10-29 DIAGNOSIS — I10 Essential (primary) hypertension: Secondary | ICD-10-CM | POA: Diagnosis not present

## 2019-10-29 DIAGNOSIS — Z6841 Body Mass Index (BMI) 40.0 and over, adult: Secondary | ICD-10-CM | POA: Diagnosis not present

## 2019-11-20 DIAGNOSIS — D649 Anemia, unspecified: Secondary | ICD-10-CM | POA: Diagnosis not present

## 2019-11-20 DIAGNOSIS — E785 Hyperlipidemia, unspecified: Secondary | ICD-10-CM | POA: Diagnosis not present

## 2019-11-20 DIAGNOSIS — I10 Essential (primary) hypertension: Secondary | ICD-10-CM | POA: Diagnosis not present

## 2019-11-20 DIAGNOSIS — E039 Hypothyroidism, unspecified: Secondary | ICD-10-CM | POA: Diagnosis not present

## 2019-11-20 DIAGNOSIS — R7303 Prediabetes: Secondary | ICD-10-CM | POA: Diagnosis not present

## 2020-01-04 ENCOUNTER — Ambulatory Visit: Payer: No Typology Code available for payment source | Attending: Internal Medicine

## 2020-01-04 DIAGNOSIS — Z23 Encounter for immunization: Secondary | ICD-10-CM | POA: Insufficient documentation

## 2020-01-04 NOTE — Progress Notes (Signed)
   Covid-19 Vaccination Clinic  Name:  Amber Pham    MRN: [id number] DOB: 06/04/1951  01/04/2020  [name] was observed post Covid-19 immunization for 15 minutes without incidence. She was provided with Vaccine Information Sheet and instruction to access the V-Safe system.   [name] was instructed to call 911 with any severe reactions post vaccine: Marland Kitchen Difficulty breathing  . Swelling of your face and throat  . A fast heartbeat  . A bad rash all over your body  . Dizziness and weakness    Immunizations Administered    Name Date Dose VIS Date Route   Pfizer COVID-19 Vaccine 01/04/2020  4:07 PM 0.3 mL 10/17/2019 Intramuscular   Manufacturer: [employer], [employer]   Lot: [id number]   NDC: [address]-5

## 2020-02-03 ENCOUNTER — Ambulatory Visit: Payer: No Typology Code available for payment source | Attending: Internal Medicine

## 2020-02-03 DIAGNOSIS — Z23 Encounter for immunization: Secondary | ICD-10-CM

## 2020-02-03 NOTE — Progress Notes (Signed)
   Covid-19 Vaccination Clinic  Name:  Amber Pham    MRN: [id number] DOB: 04-14-1951  02/03/2020  [name] was observed post Covid-19 immunization for 15 minutes without incident. She was provided with Vaccine Information Sheet and instruction to access the V-Safe system.   [name] was instructed to call 911 with any severe reactions post vaccine: Marland Kitchen Difficulty breathing  . Swelling of face and throat  . A fast heartbeat  . A bad rash all over body  . Dizziness and weakness   Immunizations Administered    Name Date Dose VIS Date Route   Pfizer COVID-19 Vaccine 02/03/2020  4:19 PM 0.3 mL 10/17/2019 Intramuscular   Manufacturer: [employer], [employer]   Lot: [id number]   NDC: [address]-7

## 2020-07-29 DIAGNOSIS — Z23 Encounter for immunization: Secondary | ICD-10-CM | POA: Diagnosis not present

## 2020-12-23 DIAGNOSIS — I10 Essential (primary) hypertension: Secondary | ICD-10-CM | POA: Diagnosis not present

## 2020-12-23 DIAGNOSIS — Z0000 Encounter for general adult medical examination without abnormal findings: Secondary | ICD-10-CM | POA: Diagnosis not present

## 2020-12-23 DIAGNOSIS — R7303 Prediabetes: Secondary | ICD-10-CM | POA: Diagnosis not present

## 2020-12-23 DIAGNOSIS — E039 Hypothyroidism, unspecified: Secondary | ICD-10-CM | POA: Diagnosis not present

## 2020-12-23 DIAGNOSIS — E785 Hyperlipidemia, unspecified: Secondary | ICD-10-CM | POA: Diagnosis not present

## 2021-08-11 DIAGNOSIS — Z23 Encounter for immunization: Secondary | ICD-10-CM | POA: Diagnosis not present

## 2022-01-05 DIAGNOSIS — Z23 Encounter for immunization: Secondary | ICD-10-CM | POA: Diagnosis not present

## 2022-01-05 DIAGNOSIS — R7303 Prediabetes: Secondary | ICD-10-CM | POA: Diagnosis not present

## 2022-01-05 DIAGNOSIS — E785 Hyperlipidemia, unspecified: Secondary | ICD-10-CM | POA: Diagnosis not present

## 2022-01-05 DIAGNOSIS — Z0000 Encounter for general adult medical examination without abnormal findings: Secondary | ICD-10-CM | POA: Diagnosis not present

## 2022-01-05 DIAGNOSIS — Z6841 Body Mass Index (BMI) 40.0 and over, adult: Secondary | ICD-10-CM | POA: Diagnosis not present

## 2022-07-18 DIAGNOSIS — K219 Gastro-esophageal reflux disease without esophagitis: Secondary | ICD-10-CM | POA: Diagnosis not present

## 2022-07-18 DIAGNOSIS — E782 Mixed hyperlipidemia: Secondary | ICD-10-CM | POA: Diagnosis not present

## 2022-07-18 DIAGNOSIS — Z1211 Encounter for screening for malignant neoplasm of colon: Secondary | ICD-10-CM | POA: Diagnosis not present

## 2023-01-19 DIAGNOSIS — I10 Essential (primary) hypertension: Secondary | ICD-10-CM | POA: Diagnosis not present

## 2023-01-19 DIAGNOSIS — Z0000 Encounter for general adult medical examination without abnormal findings: Secondary | ICD-10-CM | POA: Diagnosis not present

## 2023-01-19 DIAGNOSIS — Z1159 Encounter for screening for other viral diseases: Secondary | ICD-10-CM | POA: Diagnosis not present

## 2023-01-19 DIAGNOSIS — Z6841 Body Mass Index (BMI) 40.0 and over, adult: Secondary | ICD-10-CM | POA: Diagnosis not present

## 2023-01-19 DIAGNOSIS — E2839 Other primary ovarian failure: Secondary | ICD-10-CM | POA: Diagnosis not present

## 2023-01-19 DIAGNOSIS — E039 Hypothyroidism, unspecified: Secondary | ICD-10-CM | POA: Diagnosis not present

## 2023-01-19 DIAGNOSIS — R7303 Prediabetes: Secondary | ICD-10-CM | POA: Diagnosis not present

## 2023-01-19 DIAGNOSIS — E785 Hyperlipidemia, unspecified: Secondary | ICD-10-CM | POA: Diagnosis not present

## 2023-01-19 DIAGNOSIS — N1831 Chronic kidney disease, stage 3a: Secondary | ICD-10-CM | POA: Diagnosis not present

## 2023-01-22 ENCOUNTER — Other Ambulatory Visit: Payer: Self-pay | Admitting: Family Medicine

## 2023-01-22 DIAGNOSIS — E2839 Other primary ovarian failure: Secondary | ICD-10-CM

## 2023-01-24 ENCOUNTER — Other Ambulatory Visit: Payer: Self-pay | Admitting: Family Medicine

## 2023-01-24 DIAGNOSIS — Z0000 Encounter for general adult medical examination without abnormal findings: Secondary | ICD-10-CM

## 2023-06-06 DIAGNOSIS — Z1212 Encounter for screening for malignant neoplasm of rectum: Secondary | ICD-10-CM | POA: Diagnosis not present

## 2023-06-06 DIAGNOSIS — Z1211 Encounter for screening for malignant neoplasm of colon: Secondary | ICD-10-CM | POA: Diagnosis not present

## 2023-06-10 LAB — COLOGUARD: COLOGUARD: NEGATIVE

## 2023-06-10 LAB — EXTERNAL GENERIC LAB PROCEDURE: COLOGUARD: NEGATIVE

## 2023-08-01 ENCOUNTER — Ambulatory Visit
Admission: RE | Admit: 2023-08-01 | Discharge: 2023-08-01 | Disposition: A | Discharge status: Home / Self Care | Payer: Medicare HMO | Source: Ambulatory Visit | Attending: Family Medicine | Admitting: Family Medicine

## 2023-08-01 DIAGNOSIS — E2839 Other primary ovarian failure: Secondary | ICD-10-CM

## 2023-08-01 DIAGNOSIS — Z1231 Encounter for screening mammogram for malignant neoplasm of breast: Secondary | ICD-10-CM | POA: Diagnosis not present

## 2023-08-01 DIAGNOSIS — E349 Endocrine disorder, unspecified: Secondary | ICD-10-CM | POA: Diagnosis not present

## 2023-08-01 DIAGNOSIS — Z0000 Encounter for general adult medical examination without abnormal findings: Secondary | ICD-10-CM

## 2023-08-01 DIAGNOSIS — N958 Other specified menopausal and perimenopausal disorders: Secondary | ICD-10-CM | POA: Diagnosis not present

## 2023-08-01 DIAGNOSIS — M8588 Other specified disorders of bone density and structure, other site: Secondary | ICD-10-CM | POA: Diagnosis not present

## 2024-07-24 ENCOUNTER — Other Ambulatory Visit: Payer: Self-pay | Admitting: Family Medicine

## 2024-07-24 DIAGNOSIS — Z1231 Encounter for screening mammogram for malignant neoplasm of breast: Secondary | ICD-10-CM

## 2024-08-19 ENCOUNTER — Ambulatory Visit
Admission: RE | Admit: 2024-08-19 | Discharge: 2024-08-19 | Disposition: A | Source: Ambulatory Visit | Attending: Family Medicine | Admitting: Family Medicine

## 2024-08-19 DIAGNOSIS — Z1231 Encounter for screening mammogram for malignant neoplasm of breast: Secondary | ICD-10-CM
